# Patient Record
Sex: Male | Born: 1960 | Race: Black or African American | Hispanic: No | Marital: Married | Smoking: Current every day smoker
Health system: Southern US, Community
[De-identification: ages and names within clinical notes are randomized; demographics above are authoritative.]

## PROBLEM LIST (undated history)

## (undated) DIAGNOSIS — R519 Headache, unspecified: Secondary | ICD-10-CM

## (undated) DIAGNOSIS — M1990 Unspecified osteoarthritis, unspecified site: Secondary | ICD-10-CM

## (undated) DIAGNOSIS — R51 Headache: Secondary | ICD-10-CM

## (undated) DIAGNOSIS — R972 Elevated prostate specific antigen [PSA]: Secondary | ICD-10-CM

## (undated) DIAGNOSIS — E079 Disorder of thyroid, unspecified: Secondary | ICD-10-CM

## (undated) DIAGNOSIS — M545 Low back pain, unspecified: Secondary | ICD-10-CM

## (undated) DIAGNOSIS — G43909 Migraine, unspecified, not intractable, without status migrainosus: Secondary | ICD-10-CM

## (undated) HISTORY — DX: Low back pain, unspecified: M54.50

## (undated) HISTORY — DX: Headache, unspecified: R51.9

## (undated) HISTORY — DX: Low back pain: M54.5

## (undated) HISTORY — DX: Headache: R51

## (undated) HISTORY — PX: COLONOSCOPY: SHX174

---

## 2007-02-24 DIAGNOSIS — E039 Hypothyroidism, unspecified: Secondary | ICD-10-CM | POA: Insufficient documentation

## 2008-01-26 DIAGNOSIS — R454 Irritability and anger: Secondary | ICD-10-CM | POA: Insufficient documentation

## 2012-11-25 ENCOUNTER — Emergency Department: Payer: Self-pay | Admitting: Emergency Medicine

## 2013-04-07 ENCOUNTER — Emergency Department: Payer: Self-pay | Admitting: Emergency Medicine

## 2013-04-18 ENCOUNTER — Emergency Department: Payer: Self-pay | Admitting: Emergency Medicine

## 2013-04-29 ENCOUNTER — Emergency Department: Payer: Self-pay | Admitting: Emergency Medicine

## 2013-05-07 ENCOUNTER — Emergency Department: Payer: Self-pay | Admitting: Emergency Medicine

## 2013-10-20 ENCOUNTER — Ambulatory Visit: Payer: Self-pay | Admitting: Family Medicine

## 2013-10-20 ENCOUNTER — Emergency Department: Payer: Self-pay | Admitting: Emergency Medicine

## 2013-11-15 ENCOUNTER — Ambulatory Visit: Payer: Self-pay

## 2013-12-03 ENCOUNTER — Emergency Department: Payer: Self-pay | Admitting: Emergency Medicine

## 2014-06-15 DIAGNOSIS — F4310 Post-traumatic stress disorder, unspecified: Secondary | ICD-10-CM | POA: Insufficient documentation

## 2014-06-15 DIAGNOSIS — F331 Major depressive disorder, recurrent, moderate: Secondary | ICD-10-CM | POA: Insufficient documentation

## 2015-05-30 ENCOUNTER — Emergency Department
Admission: EM | Admit: 2015-05-30 | Discharge: 2015-05-30 | Disposition: A | Discharge status: Home / Self Care | Payer: Self-pay | Attending: Emergency Medicine | Admitting: Emergency Medicine

## 2015-05-30 ENCOUNTER — Encounter: Payer: Self-pay | Admitting: Emergency Medicine

## 2015-05-30 DIAGNOSIS — Z720 Tobacco use: Secondary | ICD-10-CM | POA: Insufficient documentation

## 2015-05-30 DIAGNOSIS — S80862A Insect bite (nonvenomous), left lower leg, initial encounter: Secondary | ICD-10-CM | POA: Insufficient documentation

## 2015-05-30 DIAGNOSIS — W57XXXA Bitten or stung by nonvenomous insect and other nonvenomous arthropods, initial encounter: Secondary | ICD-10-CM | POA: Insufficient documentation

## 2015-05-30 DIAGNOSIS — Y9389 Activity, other specified: Secondary | ICD-10-CM | POA: Insufficient documentation

## 2015-05-30 DIAGNOSIS — T63481A Toxic effect of venom of other arthropod, accidental (unintentional), initial encounter: Secondary | ICD-10-CM

## 2015-05-30 DIAGNOSIS — Y998 Other external cause status: Secondary | ICD-10-CM | POA: Insufficient documentation

## 2015-05-30 DIAGNOSIS — Y9289 Other specified places as the place of occurrence of the external cause: Secondary | ICD-10-CM | POA: Insufficient documentation

## 2015-05-30 DIAGNOSIS — Z79899 Other long term (current) drug therapy: Secondary | ICD-10-CM | POA: Insufficient documentation

## 2015-05-30 HISTORY — DX: Disorder of thyroid, unspecified: E07.9

## 2015-05-30 MED ORDER — TRIAMCINOLONE 0.1 % CREAM:EUCERIN CREAM 1:1: 1.0000 "application " | TOPICAL_CREAM | Freq: Three times a day (TID) | CUTANEOUS | Status: DC

## 2015-05-30 NOTE — ED Provider Notes (Signed)
[facility] Emergency Department Provider Note ____________________________________________  Time seen: Approximately 10:57 AM  I have reviewed the triage vital signs and the nursing notes.   HISTORY  Chief Complaint Insect Bite   HPI Brett Santos is a 54 y.o. male who presents to the emergency department for evaluation of an insect bite on the left leg that he received about 2 hours ago. He states that he felt something sting or bite him. He used some anti-itch cream just after it happened which provided relief for a few minutes. He states that the stinging and burning returned and was advised by his superior to come to the emergency department for evaluation.   Past Medical History  Diagnosis Date  . Thyroid disease     There are no active problems to display for this patient.   History reviewed. No pertinent past surgical history.  Current Outpatient Rx  Name  Route  Sig  Dispense  Refill  . levothyroxine (SYNTHROID, LEVOTHROID) 100 MCG tablet   Oral   Take 100 mcg by mouth daily before breakfast.         . Triamcinolone Acetonide (TRIAMCINOLONE 0.1 % CREAM : EUCERIN) CREA   Topical   Apply 1 application topically 3 (three) times daily.   1 each   0     Allergies Review of patient's allergies indicates no known allergies.  History reviewed. No pertinent family history.  Social History History  Substance Use Topics  . Smoking status: Current Every Day Smoker  . Smokeless tobacco: Not on file  . Alcohol Use: No    Review of Systems   Constitutional: No fever/chills Eyes: No visual changes. ENT: No congestion or rhinorrhea. Cardiovascular: Denies chest pain. Respiratory: Denies shortness of breath. Gastrointestinal: No abdominal pain.  No nausea, no vomiting.  No diarrhea.  No constipation. Genitourinary: Negative for dysuria. Musculoskeletal: Negative for back pain. Skin: Redness, burning, itching to left medial  knee/thigh Neurological: Negative for headaches, focal weakness or numbness.  10-point ROS otherwise negative.  ____________________________________________   PHYSICAL EXAM:  VITAL SIGNS: ED Triage Vitals  Enc Vitals Group     BP 05/30/15 0959 134/84 mmHg     Pulse Rate 05/30/15 0959 59     Resp 05/30/15 0959 18     Temp 05/30/15 0959 98.2 F (36.8 C)     Temp Source 05/30/15 0959 Oral     SpO2 05/30/15 0959 97 %     Weight 05/30/15 0959 180 lb (81.647 kg)     Height 05/30/15 0959 6' (1.829 m)     Head Cir --      Peak Flow --      Pain Score 05/30/15 1001 5     Pain Loc --      Pain Edu? --      Excl. in [place]? --     Constitutional: Alert and oriented. Well appearing and in no acute distress. Eyes: Conjunctivae are normal. PERRL. EOMI. Head: Atraumatic. Nose: No congestion/rhinnorhea. Mouth/Throat: Mucous membranes are moist.   Neck: No stridor. Cardiovascular: Normal rate, regular rhythm.  Good peripheral circulation. Respiratory: Normal respiratory effort.  No retractions. Lungs CTAB. Gastrointestinal: Soft and nontender. No distention. No abdominal bruits.  Musculoskeletal: No lower extremity tenderness nor edema.  No joint effusions. Neurologic:  Normal speech and language. No gross focal neurologic deficits are appreciated. Speech is normal. No gait instability. Skin: Macular,  approximately  3 cm area of very mild erythema present to the left medial knee/thigh.  No rash or vesicles noted. Psychiatric: Mood and affect are normal. Speech and behavior are normal.  ____________________________________________   LABS (all labs ordered are listed, but only abnormal results are displayed)  Labs Reviewed - No data to display ____________________________________________  EKG   ____________________________________________  RADIOLOGY   ____________________________________________   PROCEDURES  Procedure(s) performed:  None ____________________________________________   INITIAL IMPRESSION / ASSESSMENT AND PLAN / ED COURSE  Pertinent labs & imaging results that were available during my care of the patient were reviewed by me and considered in my medical decision making (see chart for details).  Patient was advised to follow-up with the primary care provider of his choice for any symptoms that are improving over the next few days. He was advised to return to the emergency department for symptoms that change or worsen if he is unable schedule an appointment. ____________________________________________   FINAL CLINICAL IMPRESSION(S) / ED DIAGNOSES  Final diagnoses:  Insect bites and stings, accidental or unintentional, initial encounter       Victorino Dike, FNP 05/30/15 [place], MD 05/30/15 1239

## 2015-05-30 NOTE — ED Notes (Signed)
Patient to ED with c/o insect bite to left inner leg about 1.5 hour ago. Reports area is now painful.

## 2015-06-19 ENCOUNTER — Encounter: Payer: Self-pay | Admitting: *Deleted

## 2015-06-19 ENCOUNTER — Emergency Department
Admission: EM | Admit: 2015-06-19 | Discharge: 2015-06-19 | Disposition: A | Discharge status: Home / Self Care | Payer: Self-pay | Attending: Emergency Medicine | Admitting: Emergency Medicine

## 2015-06-19 DIAGNOSIS — M545 Low back pain, unspecified: Secondary | ICD-10-CM

## 2015-06-19 DIAGNOSIS — Z720 Tobacco use: Secondary | ICD-10-CM | POA: Insufficient documentation

## 2015-06-19 DIAGNOSIS — Z79899 Other long term (current) drug therapy: Secondary | ICD-10-CM | POA: Insufficient documentation

## 2015-06-19 MED ORDER — CYCLOBENZAPRINE HCL 10 MG PO TABS: 10.0000 mg | ORAL_TABLET | Freq: Three times a day (TID) | ORAL | Status: DC | PRN

## 2015-06-19 MED ORDER — CYCLOBENZAPRINE HCL 10 MG PO TABS
10.0000 mg | ORAL_TABLET | Freq: Once | ORAL | Status: AC
  Administered 2015-06-19: 10 mg via ORAL
  Filled 2015-06-19: qty 1

## 2015-06-19 MED ORDER — MELOXICAM 15 MG PO TABS: 15.0000 mg | ORAL_TABLET | Freq: Every day | ORAL | Status: DC

## 2015-06-19 MED ORDER — MELOXICAM 7.5 MG PO TABS
15.0000 mg | ORAL_TABLET | Freq: Once | ORAL | Status: AC
  Administered 2015-06-19: 15 mg via ORAL
  Filled 2015-06-19: qty 2

## 2015-06-19 NOTE — ED Notes (Signed)
Pt has lower back pain.  Pt states he woke up with pain this morning.  No known injury to back

## 2015-06-19 NOTE — ED Notes (Signed)
Patient states he woke up this AM with severe back pain. He stays at [facility]. States he went to work [employer]) and went back to [employer]. Pain continued and now he is here. Pain is mid to right and radiates up back.

## 2015-06-19 NOTE — ED Provider Notes (Signed)
[facility] Emergency Department Provider Note ____________________________________________  Time seen: Approximately 11:48 PM  I have reviewed the triage vital signs and the nursing notes.   HISTORY  Chief Complaint Back Pain   HPI Jorgen Schirmer is a 54 y.o. male who presents to the emergency department for evaluation of lower back pain. He states that he noticed the pain when he woke up this morning. He is thinking that it may be coming from a mattress that he sleeping on the moment. Was able to go to work today, but the pain continued and he decided to come here for evaluation. He states that he tried to take some Aleve that did not help.   Past Medical History  Diagnosis Date  . Thyroid disease     There are no active problems to display for this patient.   No past surgical history on file.  Current Outpatient Rx  Name  Route  Sig  Dispense  Refill  . cyclobenzaprine (FLEXERIL) 10 MG tablet   Oral   Take 1 tablet (10 mg total) by mouth 3 (three) times daily as needed for muscle spasms.   30 tablet   0   . levothyroxine (SYNTHROID, LEVOTHROID) 100 MCG tablet   Oral   Take 100 mcg by mouth daily before breakfast.         . meloxicam (MOBIC) 15 MG tablet   Oral   Take 1 tablet (15 mg total) by mouth daily.   30 tablet   2   . Triamcinolone Acetonide (TRIAMCINOLONE 0.1 % CREAM : EUCERIN) CREA   Topical   Apply 1 application topically 3 (three) times daily.   1 each   0     Allergies Review of patient's allergies indicates no known allergies.  No family history on file.  Social History History  Substance Use Topics  . Smoking status: Current Every Day Smoker  . Smokeless tobacco: Not on file  . Alcohol Use: No    Review of Systems Constitutional: No recent illness. Eyes: No visual changes. ENT: No sore throat. Cardiovascular: Denies chest pain or palpitations. Respiratory: Denies shortness of breath. Gastrointestinal:  No abdominal pain.  Genitourinary: Negative for dysuria. Musculoskeletal: Pain in lumbar area Skin: Negative for rash. Neurological: Negative for headaches, focal weakness or numbness. 10-point ROS otherwise negative.  ____________________________________________   PHYSICAL EXAM:  VITAL SIGNS: ED Triage Vitals  Enc Vitals Group     BP 06/19/15 2115 102/49 mmHg     Pulse Rate 06/19/15 2115 69     Resp 06/19/15 2115 20     Temp 06/19/15 2115 98.4 F (36.9 C)     Temp Source 06/19/15 2115 Oral     SpO2 06/19/15 2115 99 %     Weight 06/19/15 2115 175 lb (79.379 kg)     Height 06/19/15 2115 6' (1.829 m)     Head Cir --      Peak Flow --      Pain Score 06/19/15 2116 10     Pain Loc --      Pain Edu? --      Excl. in [place]? --     Constitutional: Alert and oriented. Well appearing and in no acute distress. Eyes: Conjunctivae are normal. EOMI. Head: Atraumatic. Nose: No congestion/rhinnorhea. Neck: No stridor.  Respiratory: Normal respiratory effort.   Musculoskeletal: No midline tenderness upon palpation. Paraspinal tenderness noted. Neurologic:  Normal speech and language. No gross focal neurologic deficits are appreciated. Speech is normal.  No gait instability. Skin:  Skin is warm, dry and intact. Atraumatic. Psychiatric: Mood and affect are normal. Speech and behavior are normal.  ____________________________________________   LABS (all labs ordered are listed, but only abnormal results are displayed)  Labs Reviewed - No data to display ____________________________________________  RADIOLOGY  Not indicated ____________________________________________   PROCEDURES  Procedure(s) performed: None   ____________________________________________   INITIAL IMPRESSION / ASSESSMENT AND PLAN / ED COURSE  Pertinent labs & imaging results that were available during my care of the patient were reviewed by me and considered in my medical decision making (see chart for  details).  Patient was advised to take a muscle relaxer and anti-inflammatory. He was advised to rest and use ice. He was advised to follow-up with his primary care provider for symptoms that are not improving over the next few days. He is advised to return to the emergency department for symptoms that change or worsen if he is unable schedule an appointment. ____________________________________________   FINAL CLINICAL IMPRESSION(S) / ED DIAGNOSES  Final diagnoses:  Bilateral low back pain without sciatica       Victorino Dike, FNP 06/19/15 2351  Lisa Roca, MD 06/22/15 1553

## 2015-06-20 ENCOUNTER — Ambulatory Visit: Payer: Self-pay

## 2015-06-21 ENCOUNTER — Other Ambulatory Visit: Payer: Self-pay | Admitting: Urology

## 2015-06-21 DIAGNOSIS — M545 Low back pain: Secondary | ICD-10-CM

## 2015-06-22 ENCOUNTER — Emergency Department: Payer: Self-pay

## 2015-06-22 ENCOUNTER — Encounter: Payer: Self-pay | Admitting: Emergency Medicine

## 2015-06-22 ENCOUNTER — Emergency Department
Admission: EM | Admit: 2015-06-22 | Discharge: 2015-06-22 | Disposition: A | Discharge status: Home / Self Care | Payer: Self-pay | Attending: Emergency Medicine | Admitting: Emergency Medicine

## 2015-06-22 DIAGNOSIS — Y998 Other external cause status: Secondary | ICD-10-CM | POA: Insufficient documentation

## 2015-06-22 DIAGNOSIS — Y93H2 Activity, gardening and landscaping: Secondary | ICD-10-CM | POA: Insufficient documentation

## 2015-06-22 DIAGNOSIS — Z23 Encounter for immunization: Secondary | ICD-10-CM | POA: Insufficient documentation

## 2015-06-22 DIAGNOSIS — Y9289 Other specified places as the place of occurrence of the external cause: Secondary | ICD-10-CM | POA: Insufficient documentation

## 2015-06-22 DIAGNOSIS — S81812A Laceration without foreign body, left lower leg, initial encounter: Secondary | ICD-10-CM | POA: Insufficient documentation

## 2015-06-22 DIAGNOSIS — W11XXXA Fall on and from ladder, initial encounter: Secondary | ICD-10-CM | POA: Insufficient documentation

## 2015-06-22 DIAGNOSIS — Z720 Tobacco use: Secondary | ICD-10-CM | POA: Insufficient documentation

## 2015-06-22 DIAGNOSIS — S8012XA Contusion of left lower leg, initial encounter: Secondary | ICD-10-CM | POA: Insufficient documentation

## 2015-06-22 MED ORDER — BACITRACIN 500 UNIT/GM EX OINT
1.0000 "application " | TOPICAL_OINTMENT | Freq: Two times a day (BID) | CUTANEOUS | Status: DC
  Administered 2015-06-22: 1 via TOPICAL
  Filled 2015-06-22 (×2): qty 0.9

## 2015-06-22 MED ORDER — BACITRACIN ZINC 500 UNIT/GM EX OINT
TOPICAL_OINTMENT | CUTANEOUS | Status: AC
  Administered 2015-06-22: 1 via TOPICAL
  Filled 2015-06-22: qty 0.9

## 2015-06-22 MED ORDER — LIDOCAINE-EPINEPHRINE-TETRACAINE (LET) SOLUTION
3.0000 mL | Freq: Once | NASAL | Status: AC
  Administered 2015-06-22: 3 mL via TOPICAL
  Filled 2015-06-22: qty 3

## 2015-06-22 MED ORDER — TETANUS-DIPHTH-ACELL PERTUSSIS 5-2.5-18.5 LF-MCG/0.5 IM SUSP
0.5000 mL | Freq: Once | INTRAMUSCULAR | Status: AC
  Administered 2015-06-22: 0.5 mL via INTRAMUSCULAR
  Filled 2015-06-22: qty 0.5

## 2015-06-22 MED ORDER — LIDOCAINE HCL (PF) 1 % IJ SOLN
5.0000 mL | Freq: Once | INTRAMUSCULAR | Status: AC
  Administered 2015-06-22: 5 mL
  Filled 2015-06-22: qty 5

## 2015-06-22 NOTE — ED Provider Notes (Signed)
[facility] Emergency Department Provider Note  ____________________________________________  Time seen: Approximately 6:50 PM  I have reviewed the triage vital signs and the nursing notes.   HISTORY  Chief Complaint Extremity Laceration   HPI Brett Santos is a 54 y.o. male presents to the ER for the complaints of left leg laceration. Patient reports that this afternoon at 1 PM he was working outside trimming tree limbs. Patient states that he was several steps up on the ladder (unsure how high) and states as he trimmed one limb he lost his balance as the remainder of the tree limb shifted/moved and he fell. Patient states that when he fell he hit his left shin on the ladder causing laceration. Patient states that he has an abrasion from other branches to his face. Patient denies hitting head or loss of consciousness. Patient states he caught self when he fell. Patient reports pain only to the left shin where the laceration is present. Patient reports that he continued to work after the injury. Reports continues to ambulate well. States pain is 2 out of 10. Denies pain radiation.  Denies headache, nausea, vomiting, dizziness, neck or back pain. Denies chest pain, weakness, abdominal pain. Reports has continued to eat and drink well since fall. States he fell only because ladder moved when limb moved causing him to lose his balance.Patient reports that he feels well. Patient states that he was initially not going to come into the ER but states that his friend said that he probably needed stitches. Denies other complaints.   Past Medical History  Diagnosis Date  . Thyroid disease     There are no active problems to display for this patient.   History reviewed. No pertinent past surgical history.  Current Outpatient Rx  Name  Route  Sig  Dispense  Refill  . synthroid          .           .           .             Allergies Review of patient's allergies  indicates no known allergies.  No family history on file.  Social History History  Substance Use Topics  . Smoking status: Current Every Day Smoker  . Smokeless tobacco: Not on file  . Alcohol Use: No    Review of Systems Constitutional: No fever/chills Eyes: No visual changes. ENT: No sore throat. Cardiovascular: Denies chest pain. Respiratory: Denies shortness of breath. Gastrointestinal: No abdominal pain.  No nausea, no vomiting.  No diarrhea.  No constipation. Genitourinary: Negative for dysuria. Musculoskeletal: Negative for back pain. Skin: Negative for rash. Left leg laceration Neurological: Negative for headaches, focal weakness or numbness.  10-point ROS otherwise negative.  ____________________________________________   PHYSICAL EXAM:  VITAL SIGNS: ED Triage Vitals  Enc Vitals Group     BP 06/22/15 1825 115/79 mmHg     Pulse Rate 06/22/15 1825 98     Resp 06/22/15 1825 18     Temp 06/22/15 1825 98.1 F (36.7 C)     Temp Source 06/22/15 1825 Oral     SpO2 06/22/15 1825 99 %     Weight 06/22/15 1825 175 lb (79.379 kg)     Height 06/22/15 1825 6' (1.829 m)     Head Cir --      Peak Flow --      Pain Score --      Pain Loc --  Pain Edu? --      Excl. in [place]? --     Constitutional: Alert and oriented. Well appearing and in no acute distress. Smiling and laughing in room. Eyes: Conjunctivae are normal. PERRL. EOMI. No pain with EOMs.  Head: Atraumatic.nontender.   Nose: No congestion/rhinnorhea.  Mouth/Throat: Mucous membranes are moist.  Oropharynx non-erythematous. Neck: No stridor.  No cervical spine tenderness to palpation. Hematological/Lymphatic/Immunilogical: No cervical lymphadenopathy. Cardiovascular: Normal rate, regular rhythm. Grossly normal heart sounds.  Good peripheral circulation. Chest and torso nontender to palpation. Respiratory: Normal respiratory effort.  No retractions. Lungs CTAB. Gastrointestinal: Soft and nontender. No  distention. Normal Bowel sounds. No CVA tenderness. Musculoskeletal: No lower or upper extremity tenderness nor edema.  No joint effusions. Bilateral pedal pulses equal and easily palpated. No cervical, thoracic or lumbar tenderness to palpation. Except: left anterior lower tibial area 3 cm superficial laceration, mod TTP directly at laceration, left leg other wise nontender. Full ROM. Bilateral pedal pulses equal. Steady gait.  Neurologic:  Normal speech and language. No gross focal neurologic deficits are appreciated. No gait instability. Cranial nerve II through XII grossly intact. GCS 15. Skin:  Skin is warm, dry and intact. No rash noted.except: see above musculoskeletal. Mid forehead superficial abrasion, nontender, no ecchymosis.  Psychiatric: Mood and affect are normal. Speech and behavior are normal.  ____________________________________________   LABS (all labs ordered are listed, but only abnormal results are displayed)  Labs Reviewed - No data to display _ RADIOLOGY EXAM: LEFT TIBIA AND FIBULA - 2 VIEW  COMPARISON: None.  FINDINGS: There is no evidence of fracture or other focal bone lesions. Soft tissues are unremarkable.  IMPRESSION: Negative.   Electronically Signed By: Conchita Paris M.D. On: 06/22/2015 19:13      I, Marylene Land, personally viewed and evaluated these images as part of my medical decision making.    ____________________________________________   PROCEDURES  Procedure(s) performed:  LACERATION REPAIR Performed by: Marylene Land Authorized by: Marylene Land Consent: Verbal consent obtained. Risks and benefits: risks, benefits and alternatives were discussed Consent given by: patient Patient identity confirmed: provided demographic data Prepped and Draped in normal sterile fashion Wound explored  Laceration Location: left anterior shin Laceration Length: 3cm  No Foreign Bodies seen or palpated  Anesthesia: local  infiltration  Local anesthetic: lidocaine 1%  Anesthetic total: 3 ml  Irrigation method: syringe Amount of cleaning: copious irrigation and cleaning with betadine  Skin closure: 4-0 nylon  Number of sutures:4  Technique: simple interrupted.   Patient tolerance: Patient tolerated the procedure well with no immediate complications.  ____________________________________________   INITIAL IMPRESSION / ASSESSMENT AND PLAN / ED COURSE  Pertinent labs & imaging results that were available during my care of the patient were reviewed by me and considered in my medical decision making (see chart for details).  Very well-appearing patient. No acute distress. Presents the ER ambulatory, 5-6 hours post injury. Patient fell off ladder while trimming tree limbs due to limb shifting and he lost his balance. Patient reports hitting left shin on a ladder causing laceration. Denies head injury or loss of consciousness. No focal neurological deficits.Denies other pain or injury.   Left tib-fib x-ray negative. No fracture is no foreign body. Left lower leg laceration repaired. Tetanus updated. Patient tolerated well. Discussed follow-up and return parameters. Patient return to ER in 7-10 days for suture removal. Discussed cleaning and wound monitoring. Patient agreed to plan.   ____________________________________________   FINAL CLINICAL IMPRESSION(S) / ED DIAGNOSES  Final  diagnoses:  Leg laceration, left, initial encounter  Contusion of left leg, initial encounter       Marylene Land, NP 06/22/15 1957  Lisa Roca, MD 06/23/15 2101

## 2015-06-22 NOTE — ED Notes (Signed)
Patient to ER for c/o laceration to left lower leg. Has laceration approx 2 inches long with bleeding controlled. Patient states he was trimming tree when ladder fell. Patient denies any other injuries.

## 2015-06-22 NOTE — Discharge Instructions (Signed)
Keep area clean and dry. Clean daily with soap and water, rinse, pat dry and apply a thin layer topical antibiotic ointment such as Neosporin. Rest leg and elevate. Take over-the-counter Tylenol or ibuprofen as needed for pain.  Follow-up with your primary care physician as needed. Return to the ER in 7-10 days for suture removal. Return to the ER sooner for increased pain, redness, swelling, drainage, new or worsening concerns.  Laceration Care, Adult A laceration is a cut or lesion that goes through all layers of the skin and into the tissue just beneath the skin. TREATMENT  Some lacerations may not require closure. Some lacerations may not be able to be closed due to an increased risk of infection. It is important to see your caregiver as soon as possible after an injury to minimize the risk of infection and maximize the opportunity for successful closure. If closure is appropriate, pain medicines may be given, if needed. The wound will be cleaned to help prevent infection. Your caregiver will use stitches (sutures), staples, wound glue (adhesive), or skin adhesive strips to repair the laceration. These tools bring the skin edges together to allow for faster healing and a better cosmetic outcome. However, all wounds will heal with a scar. Once the wound has healed, scarring can be minimized by covering the wound with sunscreen during the day for 1 full year. HOME CARE INSTRUCTIONS  For sutures or staples:  Keep the wound clean and dry.  If you were given a bandage (dressing), you should change it at least once a day. Also, change the dressing if it becomes wet or dirty, or as directed by your caregiver.  Wash the wound with soap and water 2 times a day. Rinse the wound off with water to remove all soap. Pat the wound dry with a clean towel.  After cleaning, apply a thin layer of the antibiotic ointment as recommended by your caregiver. This will help prevent infection and keep the dressing from  sticking.  You may shower as usual after the first 24 hours. Do not soak the wound in water until the sutures are removed.  Only take over-the-counter or prescription medicines for pain, discomfort, or fever as directed by your caregiver.  Get your sutures or staples removed as directed by your caregiver. For skin adhesive strips:  Keep the wound clean and dry.  Do not get the skin adhesive strips wet. You may bathe carefully, using caution to keep the wound dry.  If the wound gets wet, pat it dry with a clean towel.  Skin adhesive strips will fall off on their own. You may trim the strips as the wound heals. Do not remove skin adhesive strips that are still stuck to the wound. They will fall off in time. For wound adhesive:  You may briefly wet your wound in the shower or bath. Do not soak or scrub the wound. Do not swim. Avoid periods of heavy perspiration until the skin adhesive has fallen off on its own. After showering or bathing, gently pat the wound dry with a clean towel.  Do not apply liquid medicine, cream medicine, or ointment medicine to your wound while the skin adhesive is in place. This may loosen the film before your wound is healed.  If a dressing is placed over the wound, be careful not to apply tape directly over the skin adhesive. This may cause the adhesive to be pulled off before the wound is healed.  Avoid prolonged exposure to sunlight or  tanning lamps while the skin adhesive is in place. Exposure to ultraviolet light in the first year will darken the scar.  The skin adhesive will usually remain in place for 5 to 10 days, then naturally fall off the skin. Do not pick at the adhesive film. You may need a tetanus shot if:  You cannot remember when you had your last tetanus shot.  You have never had a tetanus shot. If you get a tetanus shot, your arm may swell, get red, and feel warm to the touch. This is common and not a problem. If you need a tetanus shot and  you choose not to have one, there is a rare chance of getting tetanus. Sickness from tetanus can be serious. SEEK MEDICAL CARE IF:   You have redness, swelling, or increasing pain in the wound.  You see a red line that goes away from the wound.  You have yellowish-white fluid (pus) coming from the wound.  You have a fever.  You notice a bad smell coming from the wound or dressing.  Your wound breaks open before or after sutures have been removed.  You notice something coming out of the wound such as wood or glass.  Your wound is on your hand or foot and you cannot move a finger or toe. SEEK IMMEDIATE MEDICAL CARE IF:   Your pain is not controlled with prescribed medicine.  You have severe swelling around the wound causing pain and numbness or a change in color in your arm, hand, leg, or foot.  Your wound splits open and starts bleeding.  You have worsening numbness, weakness, or loss of function of any joint around or beyond the wound.  You develop painful lumps near the wound or on the skin anywhere on your body. MAKE SURE YOU:   Understand these instructions.  Will watch your condition.  Will get help right away if you are not doing well or get worse. Document Released: 11/04/2005 Document Revised: 01/27/2012 Document Reviewed: 04/30/2011 [facility] Patient Information 2015 [place], [place]. This information is not intended to replace advice given to you by your health care provider. Make sure you discuss any questions you have with your health care provider.  Contusion A contusion is a deep bruise. Contusions are the result of an injury that caused bleeding under the skin. The contusion may turn blue, purple, or yellow. Minor injuries will give you a painless contusion, but more severe contusions may stay painful and swollen for a few weeks.  CAUSES  A contusion is usually caused by a blow, trauma, or direct force to an area of the body. SYMPTOMS   Swelling and redness of  the injured area.  Bruising of the injured area.  Tenderness and soreness of the injured area.  Pain. DIAGNOSIS  The diagnosis can be made by taking a history and physical exam. An X-ray, CT scan, or MRI may be needed to determine if there were any associated injuries, such as fractures. TREATMENT  Specific treatment will depend on what area of the body was injured. In general, the best treatment for a contusion is resting, icing, elevating, and applying cold compresses to the injured area. Over-the-counter medicines may also be recommended for pain control. Ask your caregiver what the best treatment is for your contusion. HOME CARE INSTRUCTIONS   Put ice on the injured area.  Put ice in a plastic bag.  Place a towel between your skin and the bag.  Leave the ice on for 15-20 minutes,  3-4 times a day, or as directed by your health care provider.  Only take over-the-counter or prescription medicines for pain, discomfort, or fever as directed by your caregiver. Your caregiver may recommend avoiding anti-inflammatory medicines (aspirin, ibuprofen, and naproxen) for 48 hours because these medicines may increase bruising.  Rest the injured area.  If possible, elevate the injured area to reduce swelling. SEEK IMMEDIATE MEDICAL CARE IF:   You have increased bruising or swelling.  You have pain that is getting worse.  Your swelling or pain is not relieved with medicines. MAKE SURE YOU:   Understand these instructions.  Will watch your condition.  Will get help right away if you are not doing well or get worse. Document Released: 08/14/2005 Document Revised: 11/09/2013 Document Reviewed: 09/09/2011 [facility] Patient Information 2015 [place], [place]. This information is not intended to replace advice given to you by your health care provider. Make sure you discuss any questions you have with your health care provider.

## 2015-06-27 ENCOUNTER — Other Ambulatory Visit: Payer: Self-pay

## 2015-06-27 LAB — CBC AND DIFFERENTIAL
HEMATOCRIT: 42 % (ref 41–53)
Hemoglobin: 13.8 g/dL (ref 13.5–17.5)
Neutrophils Absolute: 3 /uL
Platelets: 206 10*3/uL (ref 150–399)
WBC: 7.5 10*3/mL

## 2015-06-27 LAB — TSH: TSH: 9.61 u[IU]/mL — AB (ref 0.41–5.90)

## 2015-06-27 LAB — HEMOGLOBIN A1C: Hemoglobin A1C: 5.9

## 2015-06-27 LAB — HEPATIC FUNCTION PANEL
ALT: 37 U/L (ref 10–40)
AST: 41 U/L — AB (ref 14–40)
Alkaline Phosphatase: 81 U/L (ref 25–125)
Bilirubin, Total: 0.2 mg/dL

## 2015-06-27 LAB — BASIC METABOLIC PANEL
BUN: 17 mg/dL (ref 4–21)
Creatinine: 1.1 mg/dL (ref 0.6–1.3)
GLUCOSE: 102 mg/dL
POTASSIUM: 4.5 mmol/L (ref 3.4–5.3)
Sodium: 139 mmol/L (ref 137–147)

## 2015-07-06 ENCOUNTER — Ambulatory Visit: Payer: Self-pay | Admitting: Licensed Clinical Social Worker

## 2015-07-27 ENCOUNTER — Institutional Professional Consult (permissible substitution): Payer: Self-pay | Admitting: Licensed Clinical Social Worker

## 2015-08-10 ENCOUNTER — Other Ambulatory Visit: Payer: Self-pay

## 2015-08-24 ENCOUNTER — Ambulatory Visit: Payer: Self-pay | Admitting: Licensed Clinical Social Worker

## 2015-08-29 ENCOUNTER — Other Ambulatory Visit: Payer: Self-pay

## 2015-09-07 ENCOUNTER — Ambulatory Visit: Payer: Self-pay | Admitting: Licensed Clinical Social Worker

## 2015-09-12 ENCOUNTER — Ambulatory Visit: Payer: Self-pay

## 2015-09-14 ENCOUNTER — Ambulatory Visit: Payer: Self-pay | Admitting: Licensed Clinical Social Worker

## 2015-09-28 ENCOUNTER — Ambulatory Visit: Payer: Self-pay | Admitting: Licensed Clinical Social Worker

## 2015-10-19 ENCOUNTER — Institutional Professional Consult (permissible substitution): Payer: Self-pay | Admitting: Licensed Clinical Social Worker

## 2015-10-24 ENCOUNTER — Ambulatory Visit: Payer: Self-pay

## 2016-01-02 ENCOUNTER — Ambulatory Visit: Payer: Self-pay

## 2016-01-04 ENCOUNTER — Ambulatory Visit: Payer: Self-pay

## 2016-01-04 DIAGNOSIS — E039 Hypothyroidism, unspecified: Secondary | ICD-10-CM | POA: Insufficient documentation

## 2016-01-11 ENCOUNTER — Other Ambulatory Visit: Payer: Self-pay

## 2016-01-12 DIAGNOSIS — E039 Hypothyroidism, unspecified: Secondary | ICD-10-CM

## 2016-02-01 ENCOUNTER — Other Ambulatory Visit: Payer: Self-pay

## 2016-02-08 ENCOUNTER — Ambulatory Visit: Payer: Self-pay | Admitting: Family Medicine

## 2016-02-08 VITALS — BP 97/67 | HR 90 | Wt 188.0 lb

## 2016-02-08 DIAGNOSIS — R7303 Prediabetes: Secondary | ICD-10-CM

## 2016-02-08 DIAGNOSIS — Z8669 Personal history of other diseases of the nervous system and sense organs: Secondary | ICD-10-CM

## 2016-02-08 DIAGNOSIS — E079 Disorder of thyroid, unspecified: Secondary | ICD-10-CM

## 2016-02-08 DIAGNOSIS — E049 Nontoxic goiter, unspecified: Secondary | ICD-10-CM

## 2016-02-08 DIAGNOSIS — N400 Enlarged prostate without lower urinary tract symptoms: Secondary | ICD-10-CM

## 2016-02-08 NOTE — Progress Notes (Signed)
Subjective:     Patient ID: Brett Santos, male   DOB: [birth year], 55 y.o.   MRN: [id number]  HPI Patient here to follow up on thyroid and pre-diabetes.  Review of Systems  Constitutional: Negative for activity change, appetite change and fatigue.  Respiratory: Negative for cough, chest tightness, shortness of breath and wheezing.   Cardiovascular: Negative for chest pain, palpitations and leg swelling.  Endocrine: Negative for polydipsia.  Genitourinary: Negative for urgency.       Nocturia x 3  Musculoskeletal: Positive for neck stiffness.       Objective:   Physical Exam  Constitutional: He is oriented to person, place, and time. He appears well-developed and well-nourished.  HENT:  Head: Normocephalic and atraumatic.  Eyes: Pupils are equal, round, and reactive to light.  Neck: Normal range of motion.  Goiter present  Cardiovascular: Normal rate, regular rhythm and normal heart sounds.   Pulmonary/Chest: Effort normal and breath sounds normal.  Neurological: He is alert and oriented to person, place, and time.  Skin: Skin is warm and dry.  Psychiatric: He has a normal mood and affect. His behavior is normal. Judgment and thought content normal.  Right sided goiter     Assessment:    1. Pre-diabetes Obtain Hgb A1C today.  Discussed diet and exercise with patient.  2. Goiter Obtain TSH today 3.Nocturia/BPH Check PSA     Plan:     I have done the exam and reviewed the above chart and it is accurate to the best of my knowledge.

## 2016-02-09 LAB — PSA: PROSTATE SPECIFIC AG, SERUM: 2.7 ng/mL (ref 0.0–4.0)

## 2016-02-09 LAB — TSH: TSH: 12.19 u[IU]/mL — ABNORMAL HIGH (ref 0.450–4.500)

## 2016-02-09 LAB — HEMOGLOBIN A1C
ESTIMATED AVERAGE GLUCOSE: 126 mg/dL
HEMOGLOBIN A1C: 6 % — AB (ref 4.8–5.6)

## 2016-02-22 ENCOUNTER — Ambulatory Visit: Payer: Self-pay

## 2016-11-21 ENCOUNTER — Ambulatory Visit: Payer: Self-pay

## 2016-11-28 ENCOUNTER — Ambulatory Visit: Payer: Self-pay

## 2016-12-05 ENCOUNTER — Ambulatory Visit: Payer: Self-pay

## 2016-12-10 ENCOUNTER — Ambulatory Visit: Payer: Self-pay

## 2016-12-24 ENCOUNTER — Ambulatory Visit: Payer: Self-pay

## 2017-01-14 ENCOUNTER — Ambulatory Visit: Payer: Self-pay | Admitting: Family Medicine

## 2017-01-14 VITALS — BP 123/80 | HR 87 | Temp 98.2°F | Ht 72.0 in | Wt 174.2 lb

## 2017-01-14 DIAGNOSIS — R7303 Prediabetes: Secondary | ICD-10-CM

## 2017-01-14 DIAGNOSIS — G8929 Other chronic pain: Secondary | ICD-10-CM

## 2017-01-14 DIAGNOSIS — M545 Low back pain: Secondary | ICD-10-CM

## 2017-01-14 DIAGNOSIS — R3911 Hesitancy of micturition: Secondary | ICD-10-CM

## 2017-01-14 MED ORDER — TERAZOSIN HCL 5 MG PO CAPS: 5.0000 mg | ORAL_CAPSULE | Freq: Every day | ORAL | 1 refills | Status: DC

## 2017-01-14 MED ORDER — TERAZOSIN HCL 1 MG PO CAPS: 1.0000 mg | ORAL_CAPSULE | Freq: Every day | ORAL | 3 refills | Status: DC

## 2017-01-14 MED ORDER — CYCLOBENZAPRINE HCL 5 MG PO TABS: 5.0000 mg | ORAL_TABLET | Freq: Three times a day (TID) | ORAL | 5 refills | Status: DC | PRN

## 2017-01-14 NOTE — Progress Notes (Signed)
     Primary Care Progress Note  Patient: Brett Santos Male    DOB: 1961/08/10   56 y.o.   MRN: [id number] Visit Date: 01/14/2017  Today's Provider: Lelon Huh, MD  Chief Complaint  Patient presents with  . Back Pain  . Urinary Retention    pressure/pain   Subjective:    HPI Patient states he has had trouble with low back pain and taken fexeril in the past which worked well.    Also having intermittent episodes of urinary hesitancy and urgency in the last several months.   Also due for follow up hypothyroid. Feels like energy level is good. Tolerating current dose levothyroxine  Lab Results  Component Value Date   TSH 12.190 (H) 02/08/2016   Also due for follow up diabetes with strong family history of diabetes.     No Known Allergies Previous Medications   CYCLOBENZAPRINE (FLEXERIL) 10 MG TABLET    Take 1 tablet (10 mg total) by mouth 3 (three) times daily as needed for muscle spasms.   LEVOTHYROXINE (SYNTHROID, LEVOTHROID) 100 MCG TABLET    Take 100 mcg by mouth daily before breakfast.   MELOXICAM (MOBIC) 15 MG TABLET    Take 1 tablet (15 mg total) by mouth daily.   TRIAMCINOLONE ACETONIDE (TRIAMCINOLONE 0.1 % CREAM : EUCERIN) CREA    Apply 1 application topically 3 (three) times daily.    Review of Systems  Social History  Substance Use Topics  . Smoking status: Current Every Day Smoker    Packs/day: 0.50    Years: 35.00  . Smokeless tobacco: Never Used  . Alcohol use No   Objective:   BP 123/80 (BP Location: Left Arm, Patient Position: Sitting)   Pulse 87   Temp 98.2 F (36.8 C)   Ht 6' (1.829 m)   Wt 174 lb 3.2 oz (79 kg)   BMI 23.63 kg/m   Physical Exam  General appearance: alert, well developed, well nourished, cooperative and in no distress Head: Normocephalic, without obvious abnormality, atraumatic Respiratory: Respirations even and unlabored, normal respiratory rate Extremities: No gross deformities Skin: Skin color, texture, turgor  normal. No rashes seen  Psych: Appropriate mood and affect. Neurologic: Mental status: Alert, oriented to person, place, and time, thought content appropriate.     Assessment & Plan:     1. Pre-diabetes  - Renal function panel - Hemoglobin A1c  2. Urinary hesitancy Likely BPH. He prefers to use [facility]. Start low dose terazosin. If too expensive or not tolerated we can send rx tamsulosin to [facility]. Check PSA.  - PSA - terazosin (HYTRIN) 1 MG capsule; Take 1 capsule (1 mg total) by mouth at bedtime.  Dispense: 30 capsule; Refill: 3  3. Chronic bilateral low back pain, with sciatica presence unspecified Did well on cyclobenzaprine in the past and refilled today.  - cyclobenzaprine (FLEXERIL) 5 MG tablet; Take 1-2 tablets (5-10 mg total) by mouth 3 (three) times daily as needed for muscle spasms.  Dispense: 30 tablet; Refill: 5       Lelon Huh, MD

## 2017-01-15 LAB — HEMOGLOBIN A1C
Est. average glucose Bld gHb Est-mCnc: 114 mg/dL
HEMOGLOBIN A1C: 5.6 % (ref 4.8–5.6)

## 2017-01-15 LAB — RENAL FUNCTION PANEL
ALBUMIN: 2.7 g/dL — AB (ref 3.5–5.5)
BUN/Creatinine Ratio: 10 (ref 9–20)
BUN: 12 mg/dL (ref 6–24)
CO2: 24 mmol/L (ref 18–29)
Calcium: 8.3 mg/dL — ABNORMAL LOW (ref 8.7–10.2)
Chloride: 104 mmol/L (ref 96–106)
Creatinine, Ser: 1.24 mg/dL (ref 0.76–1.27)
GFR calc Af Amer: 76 mL/min/{1.73_m2} (ref >59)
GFR calc non Af Amer: 65 mL/min/{1.73_m2} (ref >59)
Glucose: 109 mg/dL — ABNORMAL HIGH (ref 65–99)
Phosphorus: 2.8 mg/dL (ref 2.5–4.5)
Potassium: 4.1 mmol/L (ref 3.5–5.2)
Sodium: 144 mmol/L (ref 134–144)

## 2017-01-15 LAB — PSA, TOTAL AND FREE
PSA FREE: 0.55 ng/mL
PSA, Free Pct: 19 %
Prostate Specific Ag, Serum: 2.9 ng/mL (ref 0.0–4.0)

## 2017-01-28 ENCOUNTER — Ambulatory Visit: Payer: Self-pay

## 2017-02-11 ENCOUNTER — Ambulatory Visit: Payer: Self-pay

## 2017-02-18 ENCOUNTER — Ambulatory Visit: Payer: Self-pay

## 2017-03-13 ENCOUNTER — Other Ambulatory Visit: Payer: Self-pay

## 2017-03-13 DIAGNOSIS — E039 Hypothyroidism, unspecified: Secondary | ICD-10-CM

## 2017-03-13 MED ORDER — LEVOTHYROXINE SODIUM 100 MCG PO TABS: 100.0000 ug | ORAL_TABLET | Freq: Every day | ORAL | 3 refills | Status: DC

## 2017-04-08 ENCOUNTER — Ambulatory Visit: Payer: Self-pay

## 2017-04-10 ENCOUNTER — Ambulatory Visit: Payer: Self-pay

## 2017-05-18 ENCOUNTER — Emergency Department: Payer: Self-pay

## 2017-05-18 ENCOUNTER — Emergency Department
Admission: EM | Admit: 2017-05-18 | Discharge: 2017-05-18 | Disposition: A | Discharge status: Home / Self Care | Payer: Self-pay | Attending: Emergency Medicine | Admitting: Emergency Medicine

## 2017-05-18 ENCOUNTER — Encounter: Payer: Self-pay | Admitting: Emergency Medicine

## 2017-05-18 DIAGNOSIS — Z79899 Other long term (current) drug therapy: Secondary | ICD-10-CM | POA: Insufficient documentation

## 2017-05-18 DIAGNOSIS — Y929 Unspecified place or not applicable: Secondary | ICD-10-CM | POA: Insufficient documentation

## 2017-05-18 DIAGNOSIS — Y9389 Activity, other specified: Secondary | ICD-10-CM | POA: Insufficient documentation

## 2017-05-18 DIAGNOSIS — S63259A Unspecified dislocation of unspecified finger, initial encounter: Secondary | ICD-10-CM

## 2017-05-18 DIAGNOSIS — F17210 Nicotine dependence, cigarettes, uncomplicated: Secondary | ICD-10-CM | POA: Insufficient documentation

## 2017-05-18 DIAGNOSIS — E039 Hypothyroidism, unspecified: Secondary | ICD-10-CM | POA: Insufficient documentation

## 2017-05-18 DIAGNOSIS — W500XXA Accidental hit or strike by another person, initial encounter: Secondary | ICD-10-CM | POA: Insufficient documentation

## 2017-05-18 DIAGNOSIS — S63254A Unspecified dislocation of right ring finger, initial encounter: Secondary | ICD-10-CM | POA: Insufficient documentation

## 2017-05-18 DIAGNOSIS — Q899 Congenital malformation, unspecified: Secondary | ICD-10-CM

## 2017-05-18 DIAGNOSIS — Y999 Unspecified external cause status: Secondary | ICD-10-CM | POA: Insufficient documentation

## 2017-05-18 MED ORDER — TRAMADOL HCL 50 MG PO TABS: 50.0000 mg | ORAL_TABLET | Freq: Four times a day (QID) | ORAL | 0 refills | Status: DC | PRN

## 2017-05-18 MED ORDER — LIDOCAINE HCL (PF) 1 % IJ SOLN
INTRAMUSCULAR | Status: AC
  Filled 2017-05-18: qty 5

## 2017-05-18 MED ORDER — NAPROXEN 500 MG PO TABS: 500.0000 mg | ORAL_TABLET | Freq: Two times a day (BID) | ORAL | 0 refills | Status: DC

## 2017-05-18 MED ORDER — LIDOCAINE HCL (PF) 1 % IJ SOLN
5.0000 mL | Freq: Once | INTRAMUSCULAR | Status: AC
  Administered 2017-05-18: 5 mL via INTRADERMAL
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
## 2017-05-18 NOTE — ED Notes (Signed)
Pt to nursing station stating that he did not like the way that this RN had spoken to pt.  Pt requesting that someone other than this RN interact w/ pt in future.
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
## 2017-05-18 NOTE — Discharge Instructions (Signed)
Please follow up with the primary care or orthopedist. Keep the finger splinted until you are cleared to remove it. Take the medications as prescribed as needed. Return to the emergency department for symptoms that change or worsen if you're unable schedule an appointment.

## 2017-09-15 ENCOUNTER — Other Ambulatory Visit: Payer: Self-pay | Admitting: Urology

## 2017-09-15 DIAGNOSIS — E039 Hypothyroidism, unspecified: Secondary | ICD-10-CM

## 2018-01-13 ENCOUNTER — Ambulatory Visit: Payer: Self-pay

## 2018-01-27 ENCOUNTER — Ambulatory Visit: Payer: Self-pay

## 2018-04-10 ENCOUNTER — Emergency Department: Payer: Medicaid - Out of State

## 2018-04-10 ENCOUNTER — Other Ambulatory Visit: Payer: Self-pay

## 2018-04-10 ENCOUNTER — Encounter: Payer: Self-pay | Admitting: Emergency Medicine

## 2018-04-10 ENCOUNTER — Emergency Department
Admission: EM | Admit: 2018-04-10 | Discharge: 2018-04-10 | Disposition: A | Discharge status: Home / Self Care | Payer: Medicaid - Out of State | Attending: Emergency Medicine | Admitting: Emergency Medicine

## 2018-04-10 DIAGNOSIS — F17200 Nicotine dependence, unspecified, uncomplicated: Secondary | ICD-10-CM | POA: Diagnosis not present

## 2018-04-10 DIAGNOSIS — Y929 Unspecified place or not applicable: Secondary | ICD-10-CM | POA: Diagnosis not present

## 2018-04-10 DIAGNOSIS — Y999 Unspecified external cause status: Secondary | ICD-10-CM | POA: Diagnosis not present

## 2018-04-10 DIAGNOSIS — Y33XXXA Other specified events, undetermined intent, initial encounter: Secondary | ICD-10-CM | POA: Insufficient documentation

## 2018-04-10 DIAGNOSIS — Y9389 Activity, other specified: Secondary | ICD-10-CM | POA: Diagnosis not present

## 2018-04-10 DIAGNOSIS — M25561 Pain in right knee: Secondary | ICD-10-CM | POA: Diagnosis not present

## 2018-04-10 DIAGNOSIS — E039 Hypothyroidism, unspecified: Secondary | ICD-10-CM | POA: Diagnosis not present

## 2018-04-10 DIAGNOSIS — S20212A Contusion of left front wall of thorax, initial encounter: Secondary | ICD-10-CM

## 2018-04-10 DIAGNOSIS — Z79899 Other long term (current) drug therapy: Secondary | ICD-10-CM | POA: Insufficient documentation

## 2018-04-10 DIAGNOSIS — S299XXA Unspecified injury of thorax, initial encounter: Secondary | ICD-10-CM | POA: Diagnosis present

## 2018-04-10 DIAGNOSIS — G8929 Other chronic pain: Secondary | ICD-10-CM | POA: Diagnosis not present

## 2018-04-10 LAB — CBC WITH DIFFERENTIAL/PLATELET
BASOS ABS: 0 10*3/uL (ref 0–0.1)
Basophils Relative: 1 %
EOS PCT: 2 %
Eosinophils Absolute: 0.1 10*3/uL (ref 0–0.7)
HCT: 39.9 % — ABNORMAL LOW (ref 40.0–52.0)
Hemoglobin: 13.9 g/dL (ref 13.0–18.0)
Lymphocytes Relative: 26 %
Lymphs Abs: 2 10*3/uL (ref 1.0–3.6)
MCH: 32.9 pg (ref 26.0–34.0)
MCHC: 34.9 g/dL (ref 32.0–36.0)
MCV: 94.3 fL (ref 80.0–100.0)
MONO ABS: 0.7 10*3/uL (ref 0.2–1.0)
MONOS PCT: 9 %
Neutro Abs: 4.8 10*3/uL (ref 1.4–6.5)
Neutrophils Relative %: 62 %
PLATELETS: 192 10*3/uL (ref 150–440)
RBC: 4.23 MIL/uL — ABNORMAL LOW (ref 4.40–5.90)
RDW: 13.8 % (ref 11.5–14.5)
WBC: 7.7 10*3/uL (ref 3.8–10.6)

## 2018-04-10 LAB — COMPREHENSIVE METABOLIC PANEL
ALBUMIN: 2.1 g/dL — AB (ref 3.5–5.0)
ALK PHOS: 60 U/L (ref 38–126)
ALT: 26 U/L (ref 17–63)
AST: 41 U/L (ref 15–41)
Anion gap: 3 — ABNORMAL LOW (ref 5–15)
BILIRUBIN TOTAL: 0.3 mg/dL (ref 0.3–1.2)
BUN: 18 mg/dL (ref 6–20)
CALCIUM: 8 mg/dL — AB (ref 8.9–10.3)
CO2: 25 mmol/L (ref 22–32)
Chloride: 110 mmol/L (ref 101–111)
Creatinine, Ser: 1.39 mg/dL — ABNORMAL HIGH (ref 0.61–1.24)
GFR calc Af Amer: >60 mL/min (ref >60)
GFR calc non Af Amer: 55 mL/min — ABNORMAL LOW (ref >60)
GLUCOSE: 117 mg/dL — AB (ref 65–99)
POTASSIUM: 4.2 mmol/L (ref 3.5–5.1)
SODIUM: 138 mmol/L (ref 135–145)
TOTAL PROTEIN: 5.1 g/dL — AB (ref 6.5–8.1)

## 2018-04-10 LAB — TROPONIN I: Troponin I: <0.03 ng/mL (ref <0.03)

## 2018-04-10 NOTE — ED Notes (Signed)
First Nurse Note:  Patient states "I'm having tremendous chest pain", also complaining of right knee pain.  Alert and oriented.  Placed in [place].
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
## 2018-04-10 NOTE — ED Notes (Signed)
Patient transported to X-ray 

## 2018-04-10 NOTE — Discharge Instructions (Signed)
Use Tylenol or Motrin as needed for the pain. Please return for worse pain fever shortness of breath or any other problems.

## 2018-05-03 ENCOUNTER — Other Ambulatory Visit: Payer: Self-pay | Admitting: Urology

## 2018-05-03 DIAGNOSIS — E039 Hypothyroidism, unspecified: Secondary | ICD-10-CM

## 2018-05-19 ENCOUNTER — Ambulatory Visit: Payer: Self-pay

## 2018-06-05 ENCOUNTER — Other Ambulatory Visit: Payer: Self-pay

## 2018-06-05 DIAGNOSIS — E039 Hypothyroidism, unspecified: Secondary | ICD-10-CM

## 2018-06-05 MED ORDER — LEVOTHYROXINE SODIUM 100 MCG PO TABS: ORAL_TABLET | ORAL | 1 refills | Status: DC

## 2018-07-15 ENCOUNTER — Emergency Department
Admission: EM | Admit: 2018-07-15 | Discharge: 2018-07-15 | Disposition: A | Discharge status: Home / Self Care | Payer: Medicaid Other | Attending: Emergency Medicine | Admitting: Emergency Medicine

## 2018-07-15 ENCOUNTER — Encounter: Payer: Self-pay | Admitting: Emergency Medicine

## 2018-07-15 DIAGNOSIS — Z79899 Other long term (current) drug therapy: Secondary | ICD-10-CM | POA: Insufficient documentation

## 2018-07-15 DIAGNOSIS — E039 Hypothyroidism, unspecified: Secondary | ICD-10-CM | POA: Insufficient documentation

## 2018-07-15 DIAGNOSIS — F17210 Nicotine dependence, cigarettes, uncomplicated: Secondary | ICD-10-CM | POA: Diagnosis not present

## 2018-07-15 DIAGNOSIS — G43909 Migraine, unspecified, not intractable, without status migrainosus: Secondary | ICD-10-CM | POA: Diagnosis present

## 2018-07-15 DIAGNOSIS — G43901 Migraine, unspecified, not intractable, with status migrainosus: Secondary | ICD-10-CM | POA: Diagnosis not present

## 2018-07-15 HISTORY — DX: Migraine, unspecified, not intractable, without status migrainosus: G43.909

## 2018-07-15 HISTORY — DX: Unspecified osteoarthritis, unspecified site: M19.90

## 2018-07-15 MED ORDER — NAPROXEN 500 MG PO TABS: 500.0000 mg | ORAL_TABLET | Freq: Two times a day (BID) | ORAL | 2 refills | Status: AC

## 2018-07-15 MED ORDER — BUTALBITAL-APAP-CAFFEINE 50-325-40 MG PO TABS
ORAL_TABLET | ORAL | Status: AC
  Filled 2018-07-15: qty 1

## 2018-07-15 MED ORDER — BUTALBITAL-APAP-CAFFEINE 50-325-40 MG PO TABS: 1.0000 | ORAL_TABLET | Freq: Four times a day (QID) | ORAL | 0 refills | Status: AC | PRN

## 2018-07-15 MED ORDER — DIPHENHYDRAMINE HCL 50 MG/ML IJ SOLN
12.5000 mg | INTRAMUSCULAR | Status: DC
  Filled 2018-07-15: qty 1

## 2018-07-15 MED ORDER — BUTALBITAL-APAP-CAFFEINE 50-325-40 MG PO TABS
2.0000 | ORAL_TABLET | ORAL | Status: AC
  Administered 2018-07-15: 2 via ORAL

## 2018-07-15 MED ORDER — KETOROLAC TROMETHAMINE 30 MG/ML IJ SOLN
15.0000 mg | Freq: Once | INTRAMUSCULAR | Status: DC
  Filled 2018-07-15: qty 1

## 2018-07-15 MED ORDER — METOCLOPRAMIDE HCL 5 MG/ML IJ SOLN
10.0000 mg | INTRAMUSCULAR | Status: DC
  Filled 2018-07-15: qty 2

## 2018-07-15 MED ORDER — DEXAMETHASONE SODIUM PHOSPHATE 10 MG/ML IJ SOLN
10.0000 mg | Freq: Once | INTRAMUSCULAR | Status: DC
  Filled 2018-07-15: qty 1

## 2018-07-15 MED ORDER — SODIUM CHLORIDE 0.9 % IV BOLUS: 1000.0000 mL | Freq: Once | INTRAVENOUS | Status: DC

## 2018-07-15 NOTE — ED Provider Notes (Signed)
[facility] Emergency Department Provider Note  ____________________________________________   First MD Initiated Contact with Patient 07/15/18 1608     (approximate)  I have reviewed the triage vital signs and the nursing notes.   HISTORY  Chief Complaint Migraine; Shoulder Pain; and Knee Pain    HPI Brett Santos is a 57 y.o. male with medical history as listed below who presents for evaluation primarily of his headache.  He states that he has a history of migraines and been suffering from migraines more frequently over the last 2 weeks.  He says he is going through a "difficult time" right now and is currently living in a homeless shelter and this may contribute.  He says that he woke up last night about 3:00 in the morning and his "typical migraine" was back and was very severe.  It was making it difficult for him to sleep.  He feels it in the left side of his head and radiates straight through.  Loud noises and light make the symptoms worse, nothing in particular makes it better.  It is accompanied with nausea but no vomiting.  He has no lightheadedness or weakness or dizziness.  He is able to ambulate without difficulty.  He denies fever/chills, neck pain, neck stiffness, chest pain, vomiting, and abdominal pain.  He said that he has gotten some Fioricet from a friend and that seems to help for the short-term but then the headaches tend to come back.  He also reports persistent pain in his shoulders and knees, particular his left shoulder.  He states that he knows he has arthritis but that nothing particular seems to help except for some naproxen.  He takes it irregularly but it does make the pain better.  He says that his shoulder "clicks and pops" when he ranges his left shoulder around in the socket.  Again, he has no weakness in his arm but he is frustrated that nothing seems to be able to make his joint pain feel better.  No swelling, no redness, no warmth in  the joints.  No recent injuries.  Past Medical History:  Diagnosis Date  . Degenerative arthritis   . Headache   . Lower back pain    per patient  . Migraine   . Thyroid disease     Patient Active Problem List   Diagnosis Date Noted  . Pre-diabetes 01/14/2017  . Urinary hesitancy 01/14/2017  . Hypothyroidism 01/04/2016    History reviewed. No pertinent surgical history.  Prior to Admission medications   Medication Sig Start Date End Date Taking? Authorizing Provider  butalbital-acetaminophen-caffeine (FIORICET, ESGIC) [phone] MG tablet Take 1-2 tablets by mouth every 6 (six) hours as needed for headache. 07/15/18 07/15/19  Hinda Kehr, MD  cyclobenzaprine (FLEXERIL) 5 MG tablet Take 1-2 tablets (5-10 mg total) by mouth 3 (three) times daily as needed for muscle spasms. 01/14/17   Birdie Sons, MD  levothyroxine (SYNTHROID, LEVOTHROID) 100 MCG tablet TAKE 1 TABLET BY MOUTH ONCE DAILY BEFORE BREAKFAST 06/05/18   McGowan, Larene Beach A, PA-C  naproxen (NAPROSYN) 500 MG tablet Take 1 tablet (500 mg total) by mouth 2 (two) times daily with a meal. 07/15/18 07/15/19  Hinda Kehr, MD  terazosin (HYTRIN) 1 MG capsule Take 1 capsule (1 mg total) by mouth at bedtime. 01/14/17   Birdie Sons, MD  traMADol (ULTRAM) 50 MG tablet Take 1 tablet (50 mg total) by mouth every 6 (six) hours as needed. 05/18/17   Triplett, Cari B,  FNP  Triamcinolone Acetonide (TRIAMCINOLONE 0.1 % CREAM : EUCERIN) CREA Apply 1 application topically 3 (three) times daily. Patient not taking: Reported on 02/08/2016 05/30/15   Victorino Dike, FNP    Allergies Patient has no known allergies.  Family History  Problem Relation Age of Onset  . Diabetes type I Mother   . Diabetes Father   . Leukemia Father     Social History Social History   Tobacco Use  . Smoking status: Current Every Day Smoker    Packs/day: 0.50    Years: 35.00    Pack years: 17.50  . Smokeless tobacco: Never Used  Substance Use Topics  .  Alcohol use: No  . Drug use: Not on file    Review of Systems Constitutional: No fever/chills Eyes: No visual changes.  Photophobia. ENT: No sore throat. Cardiovascular: Denies chest pain. Respiratory: Denies shortness of breath. Gastrointestinal: No abdominal pain.  Nausea, no vomiting.  No diarrhea.  No constipation. Genitourinary: Negative for dysuria. Musculoskeletal: Pain in knees and shoulders, particular the left shoulder, chronic.  Negative for neck pain.  Negative for back pain. Integumentary: Negative for rash. Neurological: Headache as described above with no focal weakness, lightheadedness, dizziness, nor numbness.   ____________________________________________   PHYSICAL EXAM:  VITAL SIGNS: ED Triage Vitals  Enc Vitals Group     BP 07/15/18 1411 106/63     Pulse Rate 07/15/18 1411 83     Resp 07/15/18 1411 16     Temp 07/15/18 1411 98.3 F (36.8 C)     Temp Source 07/15/18 1411 Oral     SpO2 07/15/18 1411 98 %     Weight 07/15/18 1406 77.1 kg (170 lb)     Height 07/15/18 1406 1.829 m (6')     Head Circumference --      Peak Flow --      Pain Score 07/15/18 1406 8     Pain Loc --      Pain Edu? --      Excl. in [place]? --     Constitutional: Alert and oriented. Well appearing and in no acute distress. Eyes: Conjunctivae are normal. PERRL. EOMI. some photophobia. Head: Atraumatic. Nose: No congestion/rhinnorhea. Mouth/Throat: Mucous membranes are moist. Neck: No stridor.  No meningeal signs.   Cardiovascular: Normal rate, regular rhythm. Good peripheral circulation. Grossly normal heart sounds. Respiratory: Normal respiratory effort.  No retractions. Lungs CTAB. Gastrointestinal: Soft and nontender. No distention.  Musculoskeletal: No lower extremity tenderness nor edema. No gross deformities of extremities.  Pain with range of motion of left shoulder but the range is not limited and there is no sign of gross deformity or acute abnormality such as fracture or  dislocation. Neurologic:  Normal speech and language. No gross focal neurologic deficits are appreciated.  Skin:  Skin is warm, dry and intact. No rash noted. Psychiatric: Mood and affect are normal. Speech and behavior are normal.  ____________________________________________   LABS (all labs ordered are listed, but only abnormal results are displayed)  Labs Reviewed - No data to display ____________________________________________  EKG  None - EKG not ordered by ED physician ____________________________________________  RADIOLOGY   ED MD interpretation: No indication for imaging  Official radiology report(s): No results found.  ____________________________________________   PROCEDURES  Critical Care performed: No   Procedure(s) performed:   Procedures   ____________________________________________   INITIAL IMPRESSION / ASSESSMENT AND PLAN / ED COURSE  As part of my medical decision making, I reviewed the following data within  the electronic MEDICAL RECORD NUMBER Nursing notes reviewed and incorporated, Old chart reviewed and Notes from prior ED visits    Differential diagnosis includes, but is not limited to, migraine headache, acute intracranial abnormality such as a neoplasm or intracranial bleeding, cluster headache, rebound headache, generalized nonspecific headache.  He says that he takes naproxen but only occasionally and I do not think he is having rebound headaches.  He describes his symptoms as "my typical migraine" which is actually reassuring because he is used to the symptoms and it makes it less likely, in my opinion, to be the result of a new issue such as a neoplasm or intracranial bleed.  He has been having symptoms for several weeks and his headaches, when they occur, start gradually and get worse and are very consistent with migraine pain.  Regarding his joint pain, he is no sign of any acute or emergent abnormality.  We talked a little bit about  using naproxen regularly for the arthritis and following up with an outpatient doctor and he understands and agrees that there is no indication for any additional imaging today.  I planned to treat him with my usual migraine cocktail of metoclopramide, Benadryl, Toradol, etc., but the patient realized that it was getting late in the afternoon and if he was going to get back to the homeless shelter he needed to go soon.  As result, he asked if he does have his paperwork.  I gave him to Fioricet as well as a prescription for Fioricet since that seems to help him.  I gave him my usual and customary return precautions and he understands and agrees with the plan.     ____________________________________________  FINAL CLINICAL IMPRESSION(S) / ED DIAGNOSES  Final diagnoses:  Migraine with status migrainosus, not intractable, unspecified migraine type     MEDICATIONS GIVEN DURING THIS VISIT:  Medications  butalbital-acetaminophen-caffeine (FIORICET, ESGIC) 50-325-40 MG per tablet (has no administration in time range)  butalbital-acetaminophen-caffeine (FIORICET, ESGIC) 50-325-40 MG per tablet (has no administration in time range)  butalbital-acetaminophen-caffeine (FIORICET, ESGIC) 50-325-40 MG per tablet 2 tablet (2 tablets Oral Given 07/15/18 1733)     ED Discharge Orders         Ordered    butalbital-acetaminophen-caffeine (FIORICET, ESGIC) 50-325-40 MG tablet  Every 6 hours PRN     07/15/18 1732    naproxen (NAPROSYN) 500 MG tablet  2 times daily with meals     07/15/18 1735           Note:  This document was prepared using Dragon voice recognition software and may include unintentional dictation errors.    Hinda Kehr, MD 07/15/18 [phone]

## 2018-07-15 NOTE — ED Triage Notes (Signed)
Pt reports that he degenerative arthritis in his shoulders and knees. Pt reports hard to sleep due to the pain. Pt reports was seen here for it but they only did an x-ray. Pt inquiring about getting a MRI scan. Pt also reports a migraine. Pt reports hx of migraines as well and states this is no different.

## 2018-07-15 NOTE — Discharge Instructions (Addendum)

## 2018-09-11 ENCOUNTER — Other Ambulatory Visit: Payer: Self-pay | Admitting: Obstetrics and Gynecology

## 2018-09-11 ENCOUNTER — Ambulatory Visit
Admission: RE | Admit: 2018-09-11 | Discharge: 2018-09-11 | Disposition: A | Discharge status: Home / Self Care | Payer: Disability Insurance | Source: Ambulatory Visit | Attending: Obstetrics and Gynecology | Admitting: Obstetrics and Gynecology

## 2018-09-11 DIAGNOSIS — M19011 Primary osteoarthritis, right shoulder: Secondary | ICD-10-CM | POA: Diagnosis present

## 2018-09-11 DIAGNOSIS — M1990 Unspecified osteoarthritis, unspecified site: Secondary | ICD-10-CM

## 2018-09-11 DIAGNOSIS — M19012 Primary osteoarthritis, left shoulder: Secondary | ICD-10-CM | POA: Insufficient documentation

## 2018-09-11 DIAGNOSIS — M170 Bilateral primary osteoarthritis of knee: Secondary | ICD-10-CM | POA: Diagnosis present

## 2019-02-04 ENCOUNTER — Other Ambulatory Visit: Payer: Self-pay | Admitting: Urology

## 2019-02-04 DIAGNOSIS — E039 Hypothyroidism, unspecified: Secondary | ICD-10-CM

## 2019-07-13 ENCOUNTER — Other Ambulatory Visit: Payer: Self-pay | Admitting: Adult Health Nurse Practitioner

## 2019-07-13 DIAGNOSIS — E039 Hypothyroidism, unspecified: Secondary | ICD-10-CM

## 2019-07-16 ENCOUNTER — Other Ambulatory Visit: Payer: Self-pay | Admitting: Adult Health Nurse Practitioner

## 2019-07-16 DIAGNOSIS — E039 Hypothyroidism, unspecified: Secondary | ICD-10-CM

## 2019-08-27 ENCOUNTER — Other Ambulatory Visit: Payer: Self-pay

## 2019-08-27 ENCOUNTER — Encounter: Payer: Self-pay | Admitting: Physician Assistant

## 2019-08-27 ENCOUNTER — Telehealth: Payer: Self-pay

## 2019-08-27 DIAGNOSIS — Z1211 Encounter for screening for malignant neoplasm of colon: Secondary | ICD-10-CM

## 2019-08-27 NOTE — Telephone Encounter (Signed)
Gastroenterology Pre-Procedure Review  Request Date: 09/06/19 [facility] Requesting Physician: Dr. Vicente Males  PATIENT REVIEW QUESTIONS: The patient responded to the following health history questions as indicated:    1. Are you having any GI issues? no 2. Do you have a personal history of Polyps? no 3. Do you have a family history of Colon Cancer or Polyps? yes (mother colon polyps) 4. Diabetes Mellitus? no 5. Joint replacements in the past 12 months?no 6. Major health problems in the past 3 months?no 7. Any artificial heart valves, MVP, or defibrillator?no    MEDICATIONS & ALLERGIES:    Patient reports the following regarding taking any anticoagulation/antiplatelet therapy:   Plavix, Coumadin, Eliquis, Xarelto, Lovenox, Pradaxa, Brilinta, or Effient? no Aspirin? no  Patient confirms/reports the following medications:  Current Outpatient Medications  Medication Sig Dispense Refill  . cyclobenzaprine (FLEXERIL) 5 MG tablet Take 1-2 tablets (5-10 mg total) by mouth 3 (three) times daily as needed for muscle spasms. 30 tablet 5  . levothyroxine (SYNTHROID, LEVOTHROID) 100 MCG tablet TAKE 1 TABLET BY MOUTH ONCE DAILY BEFORE BREAKFAST 60 tablet 1  . terazosin (HYTRIN) 1 MG capsule Take 1 capsule (1 mg total) by mouth at bedtime. 30 capsule 3  . traMADol (ULTRAM) 50 MG tablet Take 1 tablet (50 mg total) by mouth every 6 (six) hours as needed. 12 tablet 0  . Triamcinolone Acetonide (TRIAMCINOLONE 0.1 % CREAM : EUCERIN) CREA Apply 1 application topically 3 (three) times daily. (Patient not taking: Reported on 02/08/2016) 1 each 0   No current facility-administered medications for this visit.     Patient confirms/reports the following allergies:  No Known Allergies  No orders of the defined types were placed in this encounter.   AUTHORIZATION INFORMATION Primary Insurance: 1D#: Group #:  Secondary Insurance: 1D#: Group #:  SCHEDULE INFORMATION: Date: 09/06/19 Time: Location:

## 2019-08-30 ENCOUNTER — Telehealth: Payer: Self-pay

## 2019-08-30 ENCOUNTER — Encounter: Payer: Self-pay | Admitting: *Deleted

## 2019-08-30 NOTE — Telephone Encounter (Signed)
Unable to contact patient. Trying to contact him in regards to picking up his colonoscopy instructions because he will not receive them in the mail in time for his procedure.  Thanks [employer]

## 2019-08-31 ENCOUNTER — Telehealth: Payer: Self-pay

## 2019-08-31 NOTE — Telephone Encounter (Signed)
Patient returned call regarding his colonoscopy instructions.  He will stop by the office on 09/02/19 after his COVID test to pick up his instructions.  Thanks [employer]

## 2019-08-31 NOTE — Telephone Encounter (Signed)
LVM for pt to call office to let me know if he prefers to pick up his instructions or email.  Thanks [employer]

## 2019-09-02 ENCOUNTER — Other Ambulatory Visit
Admission: RE | Admit: 2019-09-02 | Discharge: 2019-09-02 | Disposition: A | Discharge status: Home / Self Care | Payer: Medicaid Other | Source: Ambulatory Visit | Attending: Gastroenterology | Admitting: Gastroenterology

## 2019-09-02 ENCOUNTER — Other Ambulatory Visit: Payer: Self-pay

## 2019-09-02 DIAGNOSIS — Z01812 Encounter for preprocedural laboratory examination: Secondary | ICD-10-CM | POA: Insufficient documentation

## 2019-09-02 DIAGNOSIS — Z20828 Contact with and (suspected) exposure to other viral communicable diseases: Secondary | ICD-10-CM | POA: Diagnosis not present

## 2019-09-02 LAB — SARS CORONAVIRUS 2 (TAT 6-24 HRS): SARS Coronavirus 2: NEGATIVE

## 2019-09-06 ENCOUNTER — Other Ambulatory Visit: Payer: Self-pay

## 2019-09-06 ENCOUNTER — Encounter
Admission: RE | Disposition: A | Discharge status: Home / Self Care | Payer: Self-pay | Source: Ambulatory Visit | Attending: Gastroenterology

## 2019-09-06 ENCOUNTER — Ambulatory Visit: Payer: Medicaid Other | Admitting: Certified Registered Nurse Anesthetist

## 2019-09-06 ENCOUNTER — Encounter: Payer: Self-pay | Admitting: *Deleted

## 2019-09-06 ENCOUNTER — Ambulatory Visit
Admission: RE | Admit: 2019-09-06 | Discharge: 2019-09-06 | Disposition: A | Discharge status: Home / Self Care | Payer: Medicaid Other | Source: Ambulatory Visit | Attending: Gastroenterology | Admitting: Gastroenterology

## 2019-09-06 DIAGNOSIS — E039 Hypothyroidism, unspecified: Secondary | ICD-10-CM | POA: Diagnosis not present

## 2019-09-06 DIAGNOSIS — D122 Benign neoplasm of ascending colon: Secondary | ICD-10-CM | POA: Diagnosis not present

## 2019-09-06 DIAGNOSIS — F17210 Nicotine dependence, cigarettes, uncomplicated: Secondary | ICD-10-CM | POA: Insufficient documentation

## 2019-09-06 DIAGNOSIS — Z79890 Hormone replacement therapy (postmenopausal): Secondary | ICD-10-CM | POA: Insufficient documentation

## 2019-09-06 DIAGNOSIS — K5730 Diverticulosis of large intestine without perforation or abscess without bleeding: Secondary | ICD-10-CM | POA: Diagnosis not present

## 2019-09-06 DIAGNOSIS — Z8371 Family history of colonic polyps: Secondary | ICD-10-CM | POA: Insufficient documentation

## 2019-09-06 DIAGNOSIS — K635 Polyp of colon: Secondary | ICD-10-CM | POA: Diagnosis not present

## 2019-09-06 DIAGNOSIS — Z1211 Encounter for screening for malignant neoplasm of colon: Secondary | ICD-10-CM | POA: Insufficient documentation

## 2019-09-06 DIAGNOSIS — Z79899 Other long term (current) drug therapy: Secondary | ICD-10-CM | POA: Diagnosis not present

## 2019-09-06 DIAGNOSIS — K640 First degree hemorrhoids: Secondary | ICD-10-CM | POA: Diagnosis not present

## 2019-09-06 HISTORY — PX: COLONOSCOPY WITH PROPOFOL: SHX5780

## 2019-09-06 HISTORY — DX: Elevated prostate specific antigen (PSA): R97.20

## 2019-09-06 SURGERY — COLONOSCOPY WITH PROPOFOL
Anesthesia: General

## 2019-09-06 MED ORDER — PROPOFOL 500 MG/50ML IV EMUL
INTRAVENOUS | Status: DC | PRN
  Administered 2019-09-06: 175 ug/kg/min via INTRAVENOUS

## 2019-09-06 MED ORDER — PROPOFOL 10 MG/ML IV BOLUS
INTRAVENOUS | Status: DC | PRN
  Administered 2019-09-06: 30 mg via INTRAVENOUS
  Administered 2019-09-06: 20 mg via INTRAVENOUS
  Administered 2019-09-06: 80 mg via INTRAVENOUS

## 2019-09-06 MED ORDER — SODIUM CHLORIDE 0.9 % IV SOLN
INTRAVENOUS | Status: DC
  Administered 2019-09-06: 11:00:00 via INTRAVENOUS

## 2019-09-06 MED ORDER — LIDOCAINE HCL (CARDIAC) PF 100 MG/5ML IV SOSY
PREFILLED_SYRINGE | INTRAVENOUS | Status: DC | PRN
  Administered 2019-09-06: 50 mg via INTRAVENOUS

## 2019-09-06 NOTE — Anesthesia Procedure Notes (Signed)
Date/Time: 09/06/2019 11:15 AM Performed by: Johnna Acosta, CRNA Pre-anesthesia Checklist: Patient identified, Emergency Drugs available, Suction available, Patient being monitored and Timeout performed Patient Re-evaluated:Patient Re-evaluated prior to induction Oxygen Delivery Method: Nasal cannula Preoxygenation: Pre-oxygenation with 100% oxygen Induction Type: IV induction

## 2019-09-06 NOTE — Anesthesia Post-op Follow-up Note (Signed)
Anesthesia QCDR form completed.        

## 2019-09-06 NOTE — Anesthesia Preprocedure Evaluation (Signed)
Anesthesia Evaluation  Patient identified by MRN, date of birth, ID band Patient awake    Reviewed: Allergy & Precautions, H&P , NPO status , Patient's Chart, lab work & pertinent test results, reviewed documented beta blocker date and time   Airway Mallampati: II   Neck ROM: full    Dental  (+) Poor Dentition   Pulmonary neg pulmonary ROS, Current Smoker,    Pulmonary exam normal        Cardiovascular Exercise Tolerance: Good negative cardio ROS Normal cardiovascular exam Rhythm:regular Rate:Normal     Neuro/Psych  Headaches, negative psych ROS   GI/Hepatic negative GI ROS, Neg liver ROS,   Endo/Other  Hypothyroidism   Renal/GU negative Renal ROS  negative genitourinary   Musculoskeletal   Abdominal   Peds  Hematology negative hematology ROS (+)   Anesthesia Other Findings Past Medical History: No date: Degenerative arthritis No date: Elevated PSA No date: Headache No date: Lower back pain     Comment:  per patient No date: Migraine No date: Thyroid disease Past Surgical History: No date: COLONOSCOPY BMI    Body Mass Index: 23.73 kg/m     Reproductive/Obstetrics negative OB ROS                             Anesthesia Physical Anesthesia Plan  ASA: II  Anesthesia Plan: General   Post-op Pain Management:    Induction:   PONV Risk Score and Plan:   Airway Management Planned:   Additional Equipment:   Intra-op Plan:   Post-operative Plan:   Informed Consent: I have reviewed the patients History and Physical, chart, labs and discussed the procedure including the risks, benefits and alternatives for the proposed anesthesia with the patient or authorized representative who has indicated his/her understanding and acceptance.     Dental Advisory Given  Plan Discussed with: CRNA  Anesthesia Plan Comments:         Anesthesia Quick Evaluation

## 2019-09-06 NOTE — Op Note (Signed)
[facility] Gastroenterology Patient Name: Brett Santos Procedure Date: 09/06/2019 11:15 AM MRN: [id number] Account #: [id number] Date of Birth: Apr 27, 1961 Admit Type: Outpatient Age: 58 Room: [facility] ENDO ROOM 4 Gender: Male Note Status: Finalized Procedure:            Colonoscopy Indications:          Colon cancer screening in patient at increased risk:                        Family history of 1st-degree relative with colon polyps Providers:            Jonathon Bellows MD, MD Referring MD:         No Local Md, MD (Referring MD) Medicines:            Monitored Anesthesia Care Complications:        No immediate complications. Procedure:            Pre-Anesthesia Assessment:                       - Prior to the procedure, a History and Physical was                        performed, and patient medications, allergies and                        sensitivities were reviewed. The patient's tolerance of                        previous anesthesia was reviewed.                       - The risks and benefits of the procedure and the                        sedation options and risks were discussed with the                        patient. All questions were answered and informed                        consent was obtained.                       - ASA Grade Assessment: II - A patient with mild                        systemic disease.                       After obtaining informed consent, the colonoscope was                        passed under direct vision. Throughout the procedure,                        the patient's blood pressure, pulse, and oxygen                        saturations were monitored continuously. The  Colonoscope was introduced through the anus and                        advanced to the the cecum, identified by the                        appendiceal orifice. The colonoscopy was performed with                        ease. The patient  tolerated the procedure well. The                        quality of the bowel preparation was good. Findings:      The perianal and digital rectal examinations were normal.      Multiple small-mouthed diverticula were found in the sigmoid colon.      Non-bleeding internal hemorrhoids were found during retroflexion. The       hemorrhoids were medium-sized and Grade I (internal hemorrhoids that do       not prolapse).      A 5 mm polyp was found in the distal ascending colon. The polyp was       sessile. The polyp was removed with a cold snare. Resection and       retrieval were complete.      The exam was otherwise without abnormality on direct and retroflexion       views. Impression:           - Diverticulosis in the sigmoid colon.                       - Non-bleeding internal hemorrhoids.                       - One 5 mm polyp in the distal ascending colon, removed                        with a cold snare. Resected and retrieved.                       - The examination was otherwise normal on direct and                        retroflexion views. Recommendation:       - Discharge patient to home.                       - Resume previous diet.                       - Continue present medications.                       - Await pathology results.                       - Repeat colonoscopy in 5 years for surveillance. Procedure Code(s):    --- Professional ---                       [phone], Colonoscopy, flexible; with removal of tumor(s),  polyp(s), or other lesion(s) by snare technique Diagnosis Code(s):    --- Professional ---                       Z83.71, Family history of colonic polyps                       K64.0, First degree hemorrhoids                       K63.5, Polyp of colon                       K57.30, Diverticulosis of large intestine without                        perforation or abscess without bleeding CPT copyright 2019 American Medical Association.  All rights reserved. The codes documented in this report are preliminary and upon coder review may  be revised to meet current compliance requirements. Jonathon Bellows, MD Jonathon Bellows MD, MD 09/06/2019 11:38:45 AM This report has been signed electronically. Number of Addenda: 0 Note Initiated On: 09/06/2019 11:15 AM Scope Withdrawal Time: 0 hours 9 minutes 29 seconds  Total Procedure Duration: 0 hours 15 minutes 28 seconds  Estimated Blood Loss: Estimated blood loss: none.      [facility]

## 2019-09-06 NOTE — Transfer of Care (Signed)
Immediate Anesthesia Transfer of Care Note  Patient: Brett Santos  Procedure(s) Performed: COLONOSCOPY WITH PROPOFOL (N/A )  Patient Location: PACU  Anesthesia Type:General  Level of Consciousness: sedated  Airway & Oxygen Therapy: Patient Spontanous Breathing and Patient connected to nasal cannula oxygen  Post-op Assessment: Report given to RN and Post -op Vital signs reviewed and stable  Post vital signs: Reviewed and stable  Last Vitals:  Vitals Value Taken Time  BP 93/64 09/06/19 1143  Temp    Pulse 65 09/06/19 1144  Resp 15 09/06/19 1144  SpO2 99 % 09/06/19 1144  Vitals shown include unvalidated device data.  Last Pain:  Vitals:   09/06/19 1143  TempSrc:   PainSc: 0-No pain         Complications: No apparent anesthesia complications

## 2019-09-06 NOTE — H&P (Signed)
Jonathon Bellows, MD [address], [place], [place], [place], [address] [address], [place], [place], [place], [address] Phone: [phone]  Fax: [phone]  Primary Care Physician:  Crissie Figures, PA-C   Pre-Procedure History & Physical: HPI:  Brett Santos is a 58 y.o. male is here for an colonoscopy.   Past Medical History:  Diagnosis Date  . Degenerative arthritis   . Elevated PSA   . Headache   . Lower back pain    per patient  . Migraine   . Thyroid disease     Past Surgical History:  Procedure Laterality Date  . COLONOSCOPY      Prior to Admission medications   Medication Sig Start Date End Date Taking? Authorizing Provider  levothyroxine (SYNTHROID, LEVOTHROID) 100 MCG tablet TAKE 1 TABLET BY MOUTH ONCE DAILY BEFORE BREAKFAST 06/05/18  Yes McGowan, Larene Beach A, PA-C  terazosin (HYTRIN) 1 MG capsule Take 1 capsule (1 mg total) by mouth at bedtime. 01/14/17  Yes Birdie Sons, MD  cyclobenzaprine (FLEXERIL) 5 MG tablet Take 1-2 tablets (5-10 mg total) by mouth 3 (three) times daily as needed for muscle spasms. Patient not taking: Reported on 09/06/2019 01/14/17   Birdie Sons, MD  traMADol (ULTRAM) 50 MG tablet Take 1 tablet (50 mg total) by mouth every 6 (six) hours as needed. Patient not taking: Reported on 09/06/2019 05/18/17   Sherrie George B, FNP  Triamcinolone Acetonide (TRIAMCINOLONE 0.1 % CREAM : EUCERIN) CREA Apply 1 application topically 3 (three) times daily. Patient not taking: Reported on 02/08/2016 05/30/15   Victorino Dike, FNP    Allergies as of 08/27/2019  . (No Known Allergies)    Family History  Problem Relation Age of Onset  . Diabetes type I Mother   . Diabetes Father   . Leukemia Father     Social History   Socioeconomic History  . Marital status: Married    Spouse name: Not on file  . Number of children: Not on file  . Years of education: Not on file  . Highest education level: Not on file  Occupational History  .  Not on file  Social Needs  . Financial resource strain: Not on file  . Food insecurity    Worry: Not on file    Inability: Not on file  . Transportation needs    Medical: Not on file    Non-medical: Not on file  Tobacco Use  . Smoking status: Current Every Day Smoker    Packs/day: 0.50    Years: 35.00    Pack years: 17.50  . Smokeless tobacco: Never Used  Substance and Sexual Activity  . Alcohol use: Yes    Comment: rarely  . Drug use: Not Currently  . Sexual activity: Yes    Partners: Female  Lifestyle  . Physical activity    Days per week: Not on file    Minutes per session: Not on file  . Stress: Not on file  Relationships  . Social Herbalist on phone: Not on file    Gets together: Not on file    Attends religious service: Not on file    Active member of club or organization: Not on file    Attends meetings of clubs or organizations: Not on file    Relationship status: Not on file  . Intimate partner violence    Fear of current or ex partner: Not on file    Emotionally abused: Not on  file    Physically abused: Not on file    Forced sexual activity: Not on file  Other Topics Concern  . Not on file  Social History Narrative  . Not on file    Review of Systems: See HPI, otherwise negative ROS  Physical Exam: BP 111/62   Pulse (!) 52   Temp (!) 96.6 F (35.9 C) (Tympanic)   Resp 18   Ht 6' (1.829 m)   Wt 79.4 kg   SpO2 100%   BMI 23.73 kg/m  General:   Alert,  pleasant and cooperative in NAD Head:  Normocephalic and atraumatic. Neck:  Supple; no masses or thyromegaly. Lungs:  Clear throughout to auscultation, normal respiratory effort.    Heart:  +S1, +S2, Regular rate and rhythm, No edema. Abdomen:  Soft, nontender and nondistended. Normal bowel sounds, without guarding, and without rebound.   Neurologic:  Alert and  oriented x4;  grossly normal neurologically.  Impression/Plan: Brett Santos is here for an colonoscopy to be performed  for Screening colonoscopy family history of colon polyps   Risks, benefits, limitations, and alternatives regarding  colonoscopy have been reviewed with the patient.  Questions have been answered.  All parties agreeable.   Jonathon Bellows, MD  09/06/2019, 11:09 AM

## 2019-09-07 ENCOUNTER — Encounter: Payer: Self-pay | Admitting: Gastroenterology

## 2019-09-07 LAB — SURGICAL PATHOLOGY

## 2019-09-14 NOTE — Anesthesia Postprocedure Evaluation (Signed)
Anesthesia Post Note  Patient: Brett Santos  Procedure(s) Performed: COLONOSCOPY WITH PROPOFOL (N/A )  Patient location during evaluation: PACU Anesthesia Type: General Level of consciousness: awake and alert Pain management: pain level controlled Vital Signs Assessment: post-procedure vital signs reviewed and stable Respiratory status: spontaneous breathing, nonlabored ventilation, respiratory function stable and patient connected to nasal cannula oxygen Cardiovascular status: blood pressure returned to baseline and stable Postop Assessment: no apparent nausea or vomiting Anesthetic complications: no     Last Vitals:  Vitals:   09/06/19 1143 09/06/19 1200  BP: 93/64 120/88  Pulse: 68 71  Resp: 16 17  Temp:    SpO2: 99% 100%    Last Pain:  Vitals:   09/06/19 1200  TempSrc:   PainSc: 0-No pain                 Molli Barrows

## 2019-12-16 ENCOUNTER — Ambulatory Visit: Payer: Self-pay | Admitting: Urology

## 2020-02-07 ENCOUNTER — Encounter: Payer: Self-pay | Admitting: Urology

## 2020-02-07 ENCOUNTER — Ambulatory Visit: Payer: Self-pay | Admitting: Urology

## 2020-03-09 ENCOUNTER — Emergency Department
Admission: EM | Admit: 2020-03-09 | Discharge: 2020-03-09 | Disposition: A | Discharge status: Home / Self Care | Payer: Medicaid Other | Attending: Student in an Organized Health Care Education/Training Program | Admitting: Student in an Organized Health Care Education/Training Program

## 2020-03-09 ENCOUNTER — Emergency Department: Payer: Medicaid Other

## 2020-03-09 ENCOUNTER — Encounter: Payer: Self-pay | Admitting: Emergency Medicine

## 2020-03-09 ENCOUNTER — Other Ambulatory Visit: Payer: Self-pay

## 2020-03-09 DIAGNOSIS — Y939 Activity, unspecified: Secondary | ICD-10-CM | POA: Diagnosis not present

## 2020-03-09 DIAGNOSIS — R519 Headache, unspecified: Secondary | ICD-10-CM | POA: Insufficient documentation

## 2020-03-09 DIAGNOSIS — S61411D Laceration without foreign body of right hand, subsequent encounter: Secondary | ICD-10-CM | POA: Insufficient documentation

## 2020-03-09 DIAGNOSIS — Y92000 Kitchen of unspecified non-institutional (private) residence as  the place of occurrence of the external cause: Secondary | ICD-10-CM | POA: Diagnosis not present

## 2020-03-09 DIAGNOSIS — W01198A Fall on same level from slipping, tripping and stumbling with subsequent striking against other object, initial encounter: Secondary | ICD-10-CM | POA: Diagnosis not present

## 2020-03-09 DIAGNOSIS — Y999 Unspecified external cause status: Secondary | ICD-10-CM | POA: Insufficient documentation

## 2020-03-09 DIAGNOSIS — E039 Hypothyroidism, unspecified: Secondary | ICD-10-CM | POA: Insufficient documentation

## 2020-03-09 DIAGNOSIS — F17200 Nicotine dependence, unspecified, uncomplicated: Secondary | ICD-10-CM | POA: Insufficient documentation

## 2020-03-09 DIAGNOSIS — Z79899 Other long term (current) drug therapy: Secondary | ICD-10-CM | POA: Insufficient documentation

## 2020-03-09 LAB — CBC WITH DIFFERENTIAL/PLATELET
Abs Immature Granulocytes: 0.04 10*3/uL (ref 0.00–0.07)
Basophils Absolute: 0 10*3/uL (ref 0.0–0.1)
Basophils Relative: 0 %
Eosinophils Absolute: 0 10*3/uL (ref 0.0–0.5)
Eosinophils Relative: 0 %
HCT: 40.3 % (ref 39.0–52.0)
Hemoglobin: 13.5 g/dL (ref 13.0–17.0)
Immature Granulocytes: 0 %
Lymphocytes Relative: 16 %
Lymphs Abs: 1.5 10*3/uL (ref 0.7–4.0)
MCH: 31.3 pg (ref 26.0–34.0)
MCHC: 33.5 g/dL (ref 30.0–36.0)
MCV: 93.3 fL (ref 80.0–100.0)
Monocytes Absolute: 0.6 10*3/uL (ref 0.1–1.0)
Monocytes Relative: 6 %
Neutro Abs: 7.3 10*3/uL (ref 1.7–7.7)
Neutrophils Relative %: 78 %
Platelets: 218 10*3/uL (ref 150–400)
RBC: 4.32 MIL/uL (ref 4.22–5.81)
RDW: 13.6 % (ref 11.5–15.5)
WBC: 9.6 10*3/uL (ref 4.0–10.5)
nRBC: 0 % (ref 0.0–0.2)

## 2020-03-09 LAB — COMPREHENSIVE METABOLIC PANEL
ALT: 19 U/L (ref 0–44)
AST: 30 U/L (ref 15–41)
Albumin: 2.2 g/dL — ABNORMAL LOW (ref 3.5–5.0)
Alkaline Phosphatase: 46 U/L (ref 38–126)
Anion gap: 7 (ref 5–15)
BUN: 18 mg/dL (ref 6–20)
CO2: 24 mmol/L (ref 22–32)
Calcium: 8 mg/dL — ABNORMAL LOW (ref 8.9–10.3)
Chloride: 110 mmol/L (ref 98–111)
Creatinine, Ser: 1.35 mg/dL — ABNORMAL HIGH (ref 0.61–1.24)
GFR calc Af Amer: >60 mL/min (ref >60)
GFR calc non Af Amer: 57 mL/min — ABNORMAL LOW (ref >60)
Glucose, Bld: 114 mg/dL — ABNORMAL HIGH (ref 70–99)
Potassium: 3.9 mmol/L (ref 3.5–5.1)
Sodium: 141 mmol/L (ref 135–145)
Total Bilirubin: 0.3 mg/dL (ref 0.3–1.2)
Total Protein: 5.6 g/dL — ABNORMAL LOW (ref 6.5–8.1)

## 2020-03-09 MED ORDER — METOCLOPRAMIDE HCL 5 MG/ML IJ SOLN
10.0000 mg | Freq: Once | INTRAMUSCULAR | Status: AC
  Administered 2020-03-09: 10 mg via INTRAVENOUS
  Filled 2020-03-09: qty 2

## 2020-03-09 MED ORDER — SODIUM CHLORIDE 0.9 % IV BOLUS
1000.0000 mL | Freq: Once | INTRAVENOUS | Status: AC
  Administered 2020-03-09: 16:00:00 1000 mL via INTRAVENOUS

## 2020-03-09 MED ORDER — KETOROLAC TROMETHAMINE 10 MG PO TABS: 10.0000 mg | ORAL_TABLET | Freq: Four times a day (QID) | ORAL | 0 refills | Status: AC | PRN

## 2020-03-09 MED ORDER — DIPHENHYDRAMINE HCL 50 MG/ML IJ SOLN
25.0000 mg | Freq: Once | INTRAMUSCULAR | Status: AC
  Administered 2020-03-09: 25 mg via INTRAVENOUS
  Filled 2020-03-09: qty 1

## 2020-03-09 MED ORDER — KETOROLAC TROMETHAMINE 30 MG/ML IJ SOLN
30.0000 mg | Freq: Once | INTRAMUSCULAR | Status: AC
  Administered 2020-03-09: 30 mg via INTRAMUSCULAR
  Filled 2020-03-09: qty 1

## 2020-03-09 NOTE — ED Notes (Signed)
This RN witnessed pt resting with eyes closed, rise and fall of chest noted. Call light in reach, bed locked and low.

## 2020-03-09 NOTE — ED Notes (Signed)
Pt woken from rest by this RN, pt states, "that sleep was close to paradise, I feel totally better."

## 2020-03-09 NOTE — ED Triage Notes (Signed)
Pt presents to ED via [facility] c/o 5/10 L-sided headache starting 0600 today after slipping in kitchen Monday night and hitting head on countertop. Denies LOC. Denies blood thinner use. Pt states he took x2 percocet at 0700 today, x2 BC powders at 1100, and another x2 percocet at 1110 with no relief. Pt A&Ox4, yelling at person on phone during triage.

## 2020-03-09 NOTE — ED Provider Notes (Signed)
Emergency Department Provider Note  ____________________________________________  Time seen: Approximately 3:47 PM  I have reviewed the triage vital signs and the nursing notes.   HISTORY  Chief Complaint Headache   Historian Patient     HPI Brett Santos is a 59 y.o. male presents to the emergency department with acute onset of a frontal headache that started this morning.  Patient states that he has a history of migraines and his current headache feels similar but is stronger than what it normally is.  Patient states that he did have a head trauma on Monday.  He fell and hit his head on the countertop.  He states that he did not lose consciousness and did not have a persistent headache after injury occurred.  He denies current neck pain.  No numbness or tingling in the upper and lower extremities.  He denies current photophobia or phonophobia.  He states that he has taken Goody powders and approximately 4 Percocet today prior to presenting to the emergency department.  He states that his headache does improve with Percocet.  No fever or chills at home.   Past Medical History:  Diagnosis Date  . Degenerative arthritis   . Elevated PSA   . Headache   . Lower back pain    per patient  . Migraine   . Thyroid disease      Immunizations up to date:  Yes.     Past Medical History:  Diagnosis Date  . Degenerative arthritis   . Elevated PSA   . Headache   . Lower back pain    per patient  . Migraine   . Thyroid disease     Patient Active Problem List   Diagnosis Date Noted  . Pre-diabetes 01/14/2017  . Urinary hesitancy 01/14/2017  . Hypothyroidism 01/04/2016    Past Surgical History:  Procedure Laterality Date  . COLONOSCOPY    . COLONOSCOPY WITH PROPOFOL N/A 09/06/2019   Procedure: COLONOSCOPY WITH PROPOFOL;  Surgeon: Jonathon Bellows, MD;  Location: [facility] ENDOSCOPY;  Service: Gastroenterology;  Laterality: N/A;    Prior to Admission medications   Medication  Sig Start Date End Date Taking? Authorizing Provider  cyclobenzaprine (FLEXERIL) 5 MG tablet Take 1-2 tablets (5-10 mg total) by mouth 3 (three) times daily as needed for muscle spasms. Patient not taking: Reported on 09/06/2019 01/14/17   Birdie Sons, MD  ketorolac (TORADOL) 10 MG tablet Take 1 tablet (10 mg total) by mouth every 6 (six) hours as needed for up to 5 days. 03/09/20 03/14/20  Lannie Fields, PA-C  levothyroxine (SYNTHROID, LEVOTHROID) 100 MCG tablet TAKE 1 TABLET BY MOUTH ONCE DAILY BEFORE BREAKFAST 06/05/18   McGowan, Larene Beach A, PA-C  terazosin (HYTRIN) 1 MG capsule Take 1 capsule (1 mg total) by mouth at bedtime. 01/14/17   Birdie Sons, MD  traMADol (ULTRAM) 50 MG tablet Take 1 tablet (50 mg total) by mouth every 6 (six) hours as needed. Patient not taking: Reported on 09/06/2019 05/18/17   Sherrie George B, FNP  Triamcinolone Acetonide (TRIAMCINOLONE 0.1 % CREAM : EUCERIN) CREA Apply 1 application topically 3 (three) times daily. Patient not taking: Reported on 02/08/2016 05/30/15   Victorino Dike, FNP    Allergies Patient has no known allergies.  Family History  Problem Relation Age of Onset  . Diabetes type I Mother   . Diabetes Father   . Leukemia Father     Social History Social History   Tobacco Use  . Smoking status: Current  Every Day Smoker    Packs/day: 0.50    Years: 35.00    Pack years: 17.50  . Smokeless tobacco: Never Used  Substance Use Topics  . Alcohol use: Yes    Comment: rarely  . Drug use: Not Currently     Review of Systems  Constitutional: No fever/chills Eyes:  No discharge ENT: No upper respiratory complaints. Respiratory: no cough. No SOB/ use of accessory muscles to breath Gastrointestinal:   No nausea, no vomiting.  No diarrhea.  No constipation. Musculoskeletal: Negative for musculoskeletal pain. Neuro: Patient has headache.  Skin: Negative for rash, abrasions, lacerations,  ecchymosis.    ____________________________________________   PHYSICAL EXAM:  VITAL SIGNS: ED Triage Vitals  Enc Vitals Group     BP 03/09/20 1347 101/60     Pulse Rate 03/09/20 1347 73     Resp 03/09/20 1347 15     Temp 03/09/20 1347 98.1 F (36.7 C)     Temp Source 03/09/20 1347 Oral     SpO2 03/09/20 1347 98 %     Weight 03/09/20 1345 175 lb (79.4 kg)     Height 03/09/20 1345 6' (1.829 m)     Head Circumference --      Peak Flow --      Pain Score 03/09/20 1348 5     Pain Loc --      Pain Edu? --      Excl. in [place]? --      Constitutional: Alert and oriented. Well appearing and in no acute distress. Eyes: Conjunctivae are normal. PERRL. EOMI. Head: Atraumatic. ENT:      Ears: TMs are pearly.      Nose: No congestion/rhinnorhea.      Mouth/Throat: Mucous membranes are moist.  Neck: No stridor.  Full range of motion.  No midline C-spine tenderness to palpation. Cardiovascular: Normal rate, regular rhythm. Normal S1 and S2.  Good peripheral circulation. Respiratory: Normal respiratory effort without tachypnea or retractions. Lungs CTAB. Good air entry to the bases with no decreased or absent breath sounds Gastrointestinal: Bowel sounds x 4 quadrants. Soft and nontender to palpation. No guarding or rigidity. No distention. Musculoskeletal: Grip strength is symmetric.  5 out of 5 strength in the upper and lower extremities bilaterally and symmetrically full range of motion to all extremities. No obvious deformities noted Neurologic:  Normal for age. No gross focal neurologic deficits are appreciated.  Patient can perform hand to nose.  No hypo or hyperreflexia.  Negative Romberg. Skin:  Skin is warm, dry and intact. No rash noted. Psychiatric: Mood and affect are normal for age. Speech and behavior are normal.   ____________________________________________   LABS (all labs ordered are listed, but only abnormal results are displayed)  Labs Reviewed  COMPREHENSIVE  METABOLIC PANEL - Abnormal; Notable for the following components:      Result Value   Glucose, Bld 114 (*)    Creatinine, Ser 1.35 (*)    Calcium 8.0 (*)    Total Protein 5.6 (*)    Albumin 2.2 (*)    GFR calc non Af Amer 57 (*)    All other components within normal limits  CBC WITH DIFFERENTIAL/PLATELET   ____________________________________________  EKG   ____________________________________________  RADIOLOGY Unk Pinto, personally viewed and evaluated these images (plain radiographs) as part of my medical decision making, as well as reviewing the written report by the radiologist.  CT Head Wo Contrast  Result Date: 03/09/2020 CLINICAL DATA:  L-sided headache starting  0600 today after slipping in kitchen Monday night and hitting head on countertop. Denies LOC. Denies blood thinner use EXAM: CT HEAD WITHOUT CONTRAST TECHNIQUE: Contiguous axial images were obtained from the base of the skull through the vertex without intravenous contrast. COMPARISON:  None. FINDINGS: Brain: No evidence of acute infarction, hemorrhage, hydrocephalus, extra-axial collection or mass lesion/mass effect. Mild areas of subcortical white matter hypoattenuation are noted consistent with chronic microvascular ischemic change. Vascular: No hyperdense vessel or unexpected calcification. Skull: Normal. Negative for fracture or focal lesion. Sinuses/Orbits: Visualized globes and orbits are unremarkable. The visualized sinuses are clear. Other: None. IMPRESSION: 1. No acute intracranial abnormalities. 2. Mild chronic microvascular ischemic change. Electronically Signed   By: Lajean Manes M.D.   On: 03/09/2020 14:42    ____________________________________________    PROCEDURES  Procedure(s) performed:     Procedures     Medications  sodium chloride 0.9 % bolus 1,000 mL (1,000 mLs Intravenous New Bag/Given 03/09/20 1627)  ketorolac (TORADOL) 30 MG/ML injection 30 mg (30 mg Intramuscular Given  03/09/20 1627)  metoCLOPramide (REGLAN) injection 10 mg (10 mg Intravenous Given 03/09/20 1628)  diphenhydrAMINE (BENADRYL) injection 25 mg (25 mg Intravenous Given 03/09/20 1627)     ____________________________________________   INITIAL IMPRESSION / ASSESSMENT AND PLAN / ED COURSE  Pertinent labs & imaging results that were available during my care of the patient were reviewed by me and considered in my medical decision making (see chart for details).      Assessment and Plan:  Headache:  59 year old male presents to the emergency department with a persistent headache for the past 4 to 5 days.  Vital signs are reassuring at triage.  Neuro exam was appropriate without acute deficits.  Differential diagnosis includes subarachnoid hemorrhage, subdural hematoma, skull fracture.  CT head revealed no evidence of intracranial bleed.  Patient was given Toradol, Benadryl, Reglan and supplemental fluids in the emergency department he reported that his headache completely resolved.  He states that he feels well enough to go home.  He was discharged with oral Toradol.  He was advised to follow-up with primary care if his migraines persist and he needs an abortive migraine medicine.  Return precautions were given to return with new or worsening symptoms.  All patient questions were answered.  ____________________________________________  FINAL CLINICAL IMPRESSION(S) / ED DIAGNOSES  Final diagnoses:  Acute nonintractable headache, unspecified headache type      NEW MEDICATIONS STARTED DURING THIS VISIT:  ED Discharge Orders         Ordered    ketorolac (TORADOL) 10 MG tablet  Every 6 hours PRN     03/09/20 1725              This chart was dictated using voice recognition software/Dragon. Despite best efforts to proofread, errors can occur which can change the meaning. Any change was purely unintentional.     Lannie Fields, PA-C 03/09/20 1732    Merlyn Lot,  MD 03/09/20 [phone]

## 2020-03-09 NOTE — ED Notes (Signed)
Pt requesting migraine medicine and splint and bandage and antibiotic ointment for right thumb/hand. Pt told requests would be relayed to EDP.

## 2020-03-09 NOTE — ED Notes (Signed)
Patient requests for laceration to be examined while in ER. Had suture repair done on Monday at urgent care, one suture came out prematurely.

## 2020-03-09 NOTE — Discharge Instructions (Signed)
Increase hydration at home. Take Toradol for headache.  Return to the ED with new or worsening symptoms.

## 2020-03-14 ENCOUNTER — Other Ambulatory Visit: Payer: Self-pay

## 2020-03-14 ENCOUNTER — Emergency Department
Admission: EM | Admit: 2020-03-14 | Discharge: 2020-03-14 | Discharge status: Against Medical Advice | Payer: Medicaid Other | Attending: Student | Admitting: Student

## 2020-03-14 ENCOUNTER — Encounter: Payer: Self-pay | Admitting: Emergency Medicine

## 2020-03-14 DIAGNOSIS — E039 Hypothyroidism, unspecified: Secondary | ICD-10-CM | POA: Diagnosis not present

## 2020-03-14 DIAGNOSIS — G43819 Other migraine, intractable, without status migrainosus: Secondary | ICD-10-CM | POA: Insufficient documentation

## 2020-03-14 DIAGNOSIS — R519 Headache, unspecified: Secondary | ICD-10-CM | POA: Diagnosis present

## 2020-03-14 DIAGNOSIS — F17210 Nicotine dependence, cigarettes, uncomplicated: Secondary | ICD-10-CM | POA: Insufficient documentation

## 2020-03-14 DIAGNOSIS — Z79899 Other long term (current) drug therapy: Secondary | ICD-10-CM | POA: Diagnosis not present

## 2020-03-14 NOTE — ED Notes (Signed)
See triage note- Pt reports migraine that started several hours ago w/ sensitivity to light. States left side of head hurts. Reports hx of chronic migraines.

## 2020-03-14 NOTE — ED Provider Notes (Signed)
[facility] Emergency Department Provider Note  ____________________________________________  Time seen: Approximately 2:17 PM  I have reviewed the triage vital signs and the nursing notes.   HISTORY  Chief Complaint Migraine    HPI Brett Santos is a 59 y.o. male that presents to the emergency department for evaluation of migraine.  Patient has a history of migraines and this feels the same.  He was here last week for a similar migraine after he hit his head. He was unable to pick up his prescription medication.   Past Medical History:  Diagnosis Date  . Degenerative arthritis   . Elevated PSA   . Headache   . Lower back pain    per patient  . Migraine   . Thyroid disease     Patient Active Problem List   Diagnosis Date Noted  . Pre-diabetes 01/14/2017  . Urinary hesitancy 01/14/2017  . Hypothyroidism 01/04/2016    Past Surgical History:  Procedure Laterality Date  . COLONOSCOPY    . COLONOSCOPY WITH PROPOFOL N/A 09/06/2019   Procedure: COLONOSCOPY WITH PROPOFOL;  Surgeon: Jonathon Bellows, MD;  Location: [facility] ENDOSCOPY;  Service: Gastroenterology;  Laterality: N/A;    Prior to Admission medications   Medication Sig Start Date End Date Taking? Authorizing Provider  ketorolac (TORADOL) 10 MG tablet Take 1 tablet (10 mg total) by mouth every 6 (six) hours as needed for up to 5 days. 03/09/20 03/14/20  Lannie Fields, PA-C  levothyroxine (SYNTHROID, LEVOTHROID) 100 MCG tablet TAKE 1 TABLET BY MOUTH ONCE DAILY BEFORE BREAKFAST 06/05/18   McGowan, Larene Beach A, PA-C  terazosin (HYTRIN) 1 MG capsule Take 1 capsule (1 mg total) by mouth at bedtime. 01/14/17   Birdie Sons, MD    Allergies Patient has no known allergies.  Family History  Problem Relation Age of Onset  . Diabetes type I Mother   . Diabetes Father   . Leukemia Father     Social History Social History   Tobacco Use  . Smoking status: Current Every Day Smoker    Packs/day: 0.50     Years: 35.00    Pack years: 17.50  . Smokeless tobacco: Never Used  Substance Use Topics  . Alcohol use: Yes    Comment: rarely  . Drug use: Not Currently     Review of Systems  Gastrointestinal: No nausea, no vomiting.  Musculoskeletal: Negative for musculoskeletal pain. Skin: Negative for rash, abrasions, lacerations, ecchymosis. Neurological: Positive for migraine   ____________________________________________   PHYSICAL EXAM:  VITAL SIGNS: ED Triage Vitals  Enc Vitals Group     BP 03/14/20 1336 110/82     Pulse Rate 03/14/20 1336 70     Resp 03/14/20 1336 16     Temp 03/14/20 1336 98 F (36.7 C)     Temp Source 03/14/20 1336 Oral     SpO2 03/14/20 1336 98 %     Weight 03/14/20 1337 174 lb 15.7 oz (79.4 kg)     Height 03/14/20 1337 6' (1.829 m)     Head Circumference --      Peak Flow --      Pain Score 03/14/20 1337 8     Pain Loc --      Pain Edu? --      Excl. in [place]? --      Constitutional: Alert and oriented.  Eyes: Conjunctivae are normal. PERRL. EOMI. Head: Atraumatic. ENT:      Ears:      Nose: No congestion/rhinnorhea.  Mouth/Throat: Mucous membranes are moist.  Neck: No stridor. Cardiovascular: Normal rate, regular rhythm.  Good peripheral circulation. Respiratory: Normal respiratory effort without tachypnea or retractions. Lungs CTAB. Good air entry to the bases with no decreased or absent breath sounds. Musculoskeletal: Full range of motion to all extremities. No gross deformities appreciated. Neurologic:  Normal speech and language. No gross focal neurologic deficits are appreciated.  Skin:  Skin is warm, dry and intact. No rash noted. Psychiatric: Mood and affect are normal. Speech and behavior are normal. Patient exhibits appropriate insight and judgement.   ____________________________________________   LABS (all labs ordered are listed, but only abnormal results are displayed)  Labs Reviewed - No data to  display ____________________________________________  EKG   ____________________________________________  RADIOLOGY  No results found.  ____________________________________________    PROCEDURES  Procedure(s) performed:    Procedures    Medications - No data to display   ____________________________________________   INITIAL IMPRESSION / ASSESSMENT AND PLAN / ED COURSE  Pertinent labs & imaging results that were available during my care of the patient were reviewed by me and considered in my medical decision making (see chart for details).  Review of the [place] CSRS was performed in accordance of the [place] prior to dispensing any controlled drugs.   Patient presented to emergency department for evaluation of migraine.  Patient was agitated in the emergency department prior to my evaluation so someone in the ED had requested security presence.  While I was evaluating the patient, he saw security outside in the hallway and became increasingly agitated and began raising his voice.  Patient started approaching me, so I proceeded to open the door and stepped into the hallway.  Patient became upset that security was near his door and walked out of the emergency department.  Brett Santos was evaluated in Emergency Department on 03/14/2020 for the symptoms described in the history of present illness. He was evaluated in the context of the global COVID-19 pandemic, which necessitated consideration that the patient might be at risk for infection with the SARS-CoV-2 virus that causes COVID-19. Institutional protocols and algorithms that pertain to the evaluation of patients at risk for COVID-19 are in a state of rapid change based on information released by regulatory bodies including the CDC and federal and state organizations. These policies and algorithms were followed during the patient's care in the ED.    ____________________________________________  FINAL CLINICAL IMPRESSION(S) /  ED DIAGNOSES  Final diagnoses:  Other migraine without status migrainosus, intractable      NEW MEDICATIONS STARTED DURING THIS VISIT:  ED Discharge Orders    None          This chart was dictated using voice recognition software/Dragon. Despite best efforts to proofread, errors can occur which can change the meaning. Any change was purely unintentional.    Laban Emperor, PA-C 03/14/20 1505    Lilia Pro., MD 03/14/20 [phone]

## 2020-03-14 NOTE — ED Triage Notes (Signed)
Says migrain3e for couple hours.  History of same.  Brought by ems.  Vss.  [id number].  Says feels like migraine.  Was here a couple days ago and he has not been able to ge the rx yet.

## 2020-04-26 IMAGING — CT CT HEAD W/O CM
3 series · 15 of 46 positions shown, 18 images · non-contrast
Comparison: None.

CLINICAL DATA: L-sided headache starting 0700 today after slipping
in kitchen [REDACTED] night and hitting head on countertop. Denies LOC.
Denies blood thinner use

EXAM:
CT HEAD WITHOUT CONTRAST
TECHNIQUE: Contiguous axial images were obtained from the base of the skull
through the vertex without intravenous contrast.

[Series 2: head wo · axial · 0.42mm/px · z∈[+309,+429]mm · 9 of 29 slices shown, 12 images]
[im 3/29  brain]
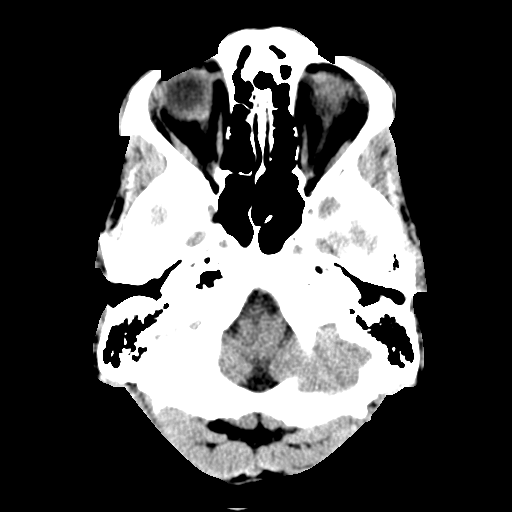
[im 3/29  bone]
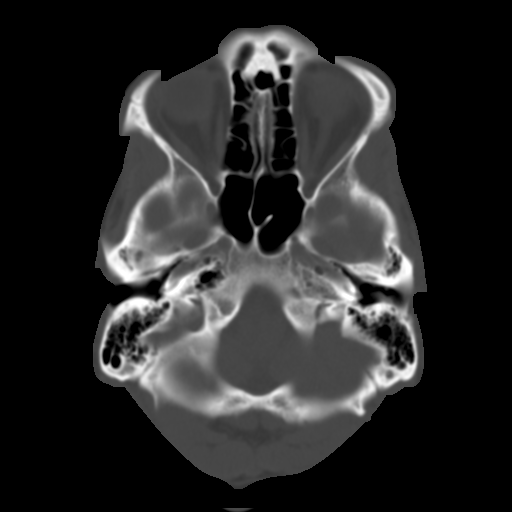
[im 6/29  brain]
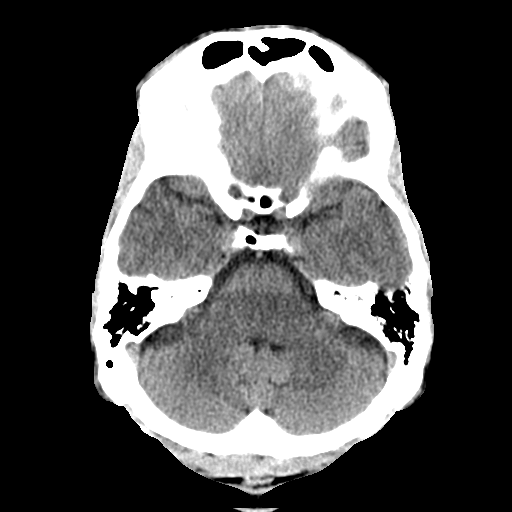
[im 9/29  brain]
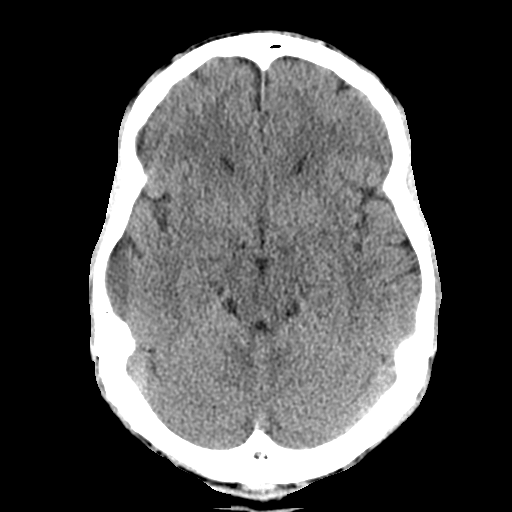
[im 12/29  brain]
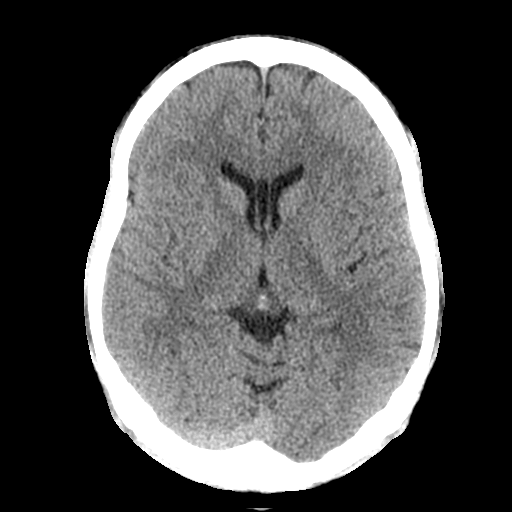
[im 15/29  brain]
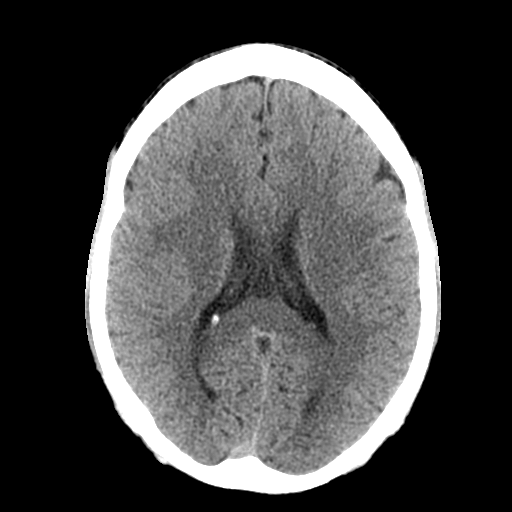
[im 15/29  bone]
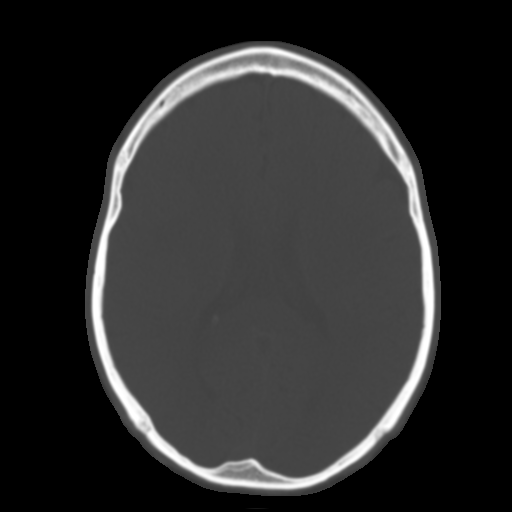
[im 18/29  brain]
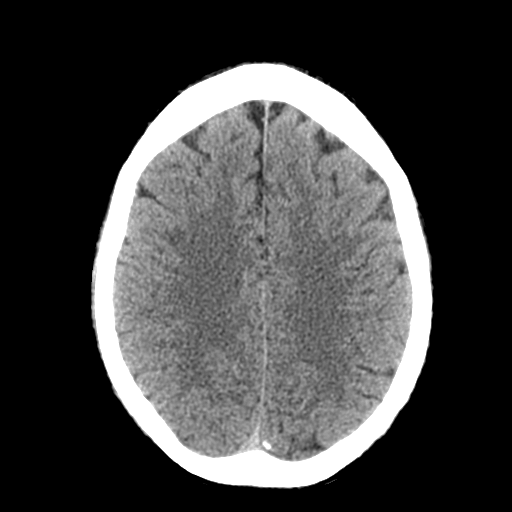
[im 21/29  brain]
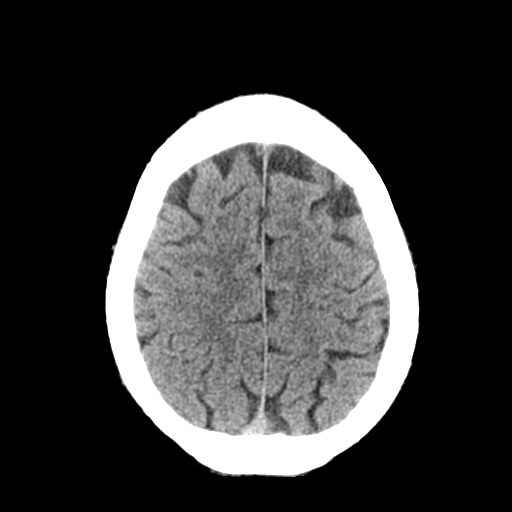
[im 24/29  brain]
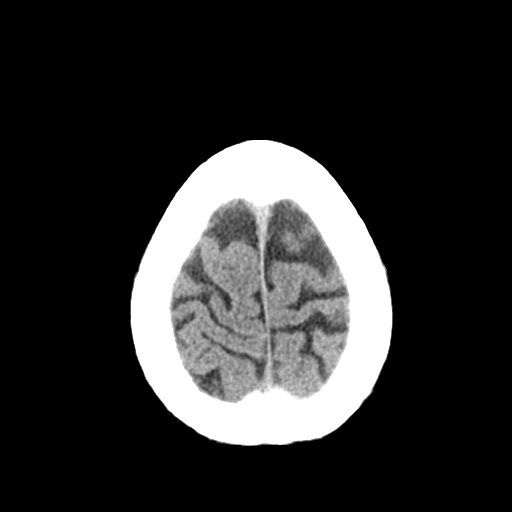
[im 27/29  brain]
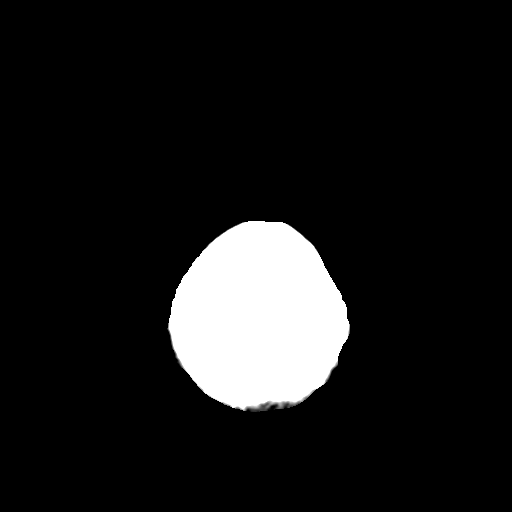
[im 27/29  bone]
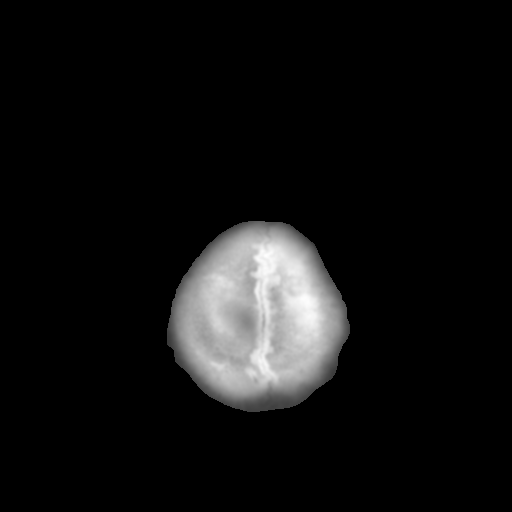

[Series 4: coronal soft tissue · coronal · 0.29mm/px · 3 of 68 slices shown]
[im 23/68  brain]
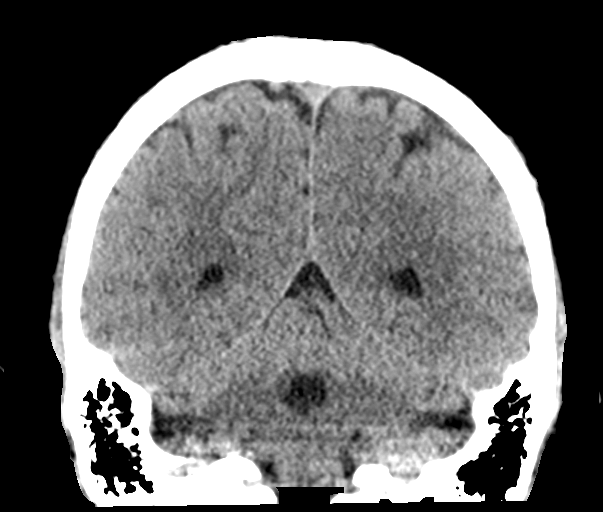
[im 30/68  brain]
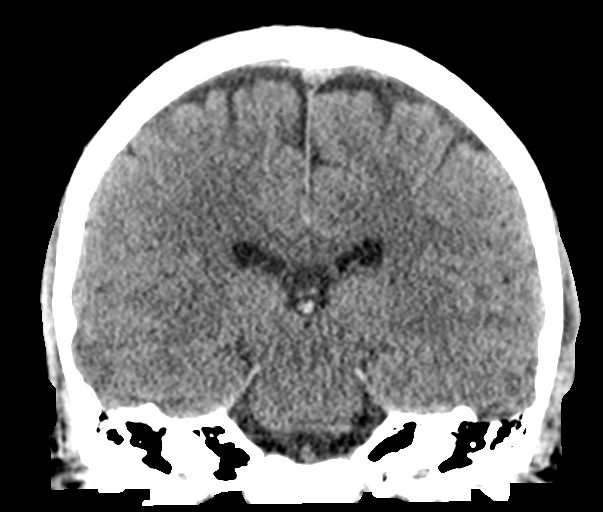
[im 38/68  brain]
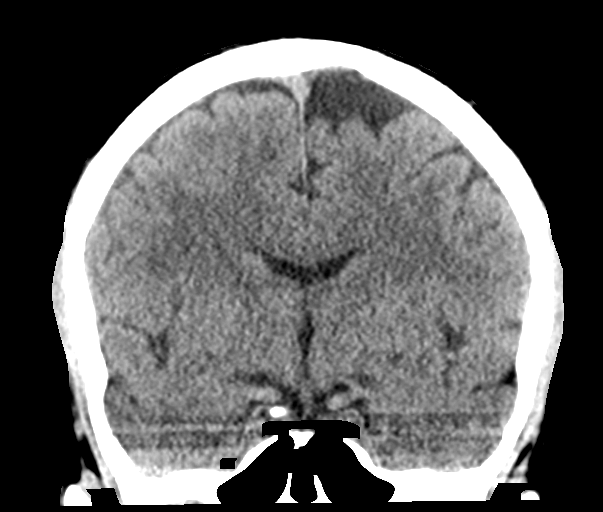

[Series 5: sagittal soft tissue · sagittal · 0.29mm/px · 3 of 59 slices shown]
[im 20/59  brain]
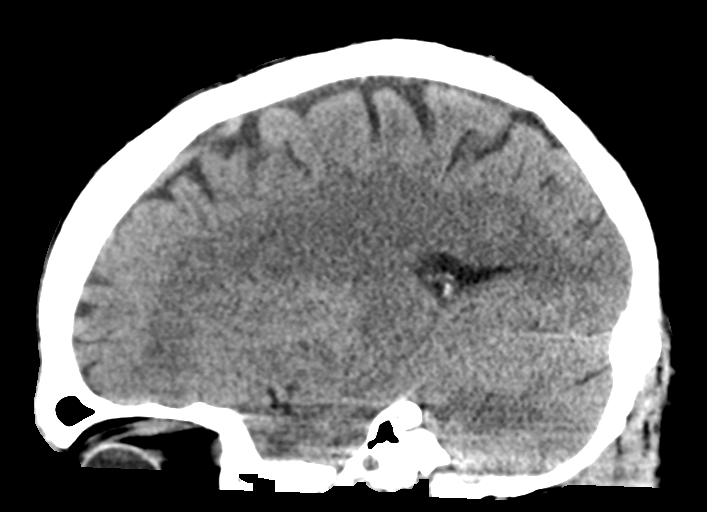
[im 30/59  brain]
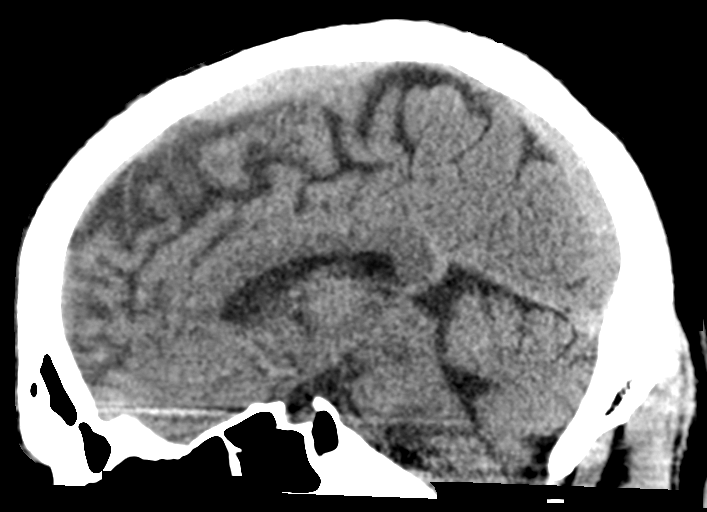
[im 39/59  brain]
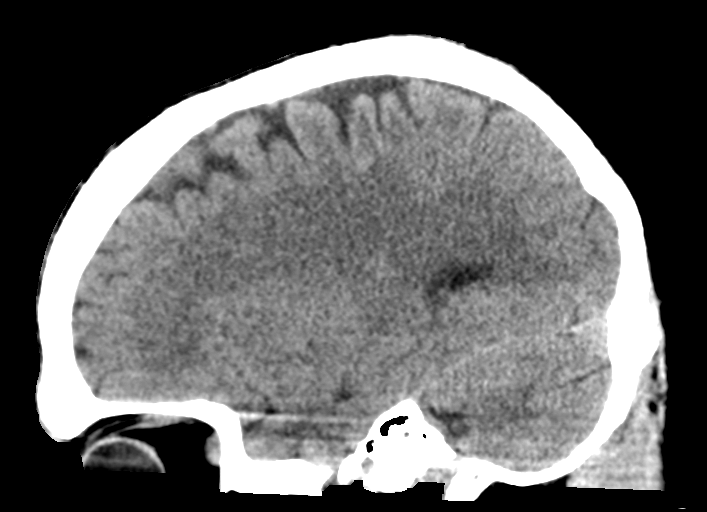

[15 of 46 positions shown; findings below may reference images not displayed]

FINDINGS: Brain: No evidence of acute infarction, hemorrhage, hydrocephalus,
extra-axial collection or mass lesion/mass effect.

Mild areas of subcortical white matter hypoattenuation are noted
consistent with chronic microvascular ischemic change.

Vascular: No hyperdense vessel or unexpected calcification.

Skull: Normal. Negative for fracture or focal lesion.

Sinuses/Orbits: Visualized globes and orbits are unremarkable. The
visualized sinuses are clear.

Other: None.
IMPRESSION: 1. No acute intracranial abnormalities.
2. Mild chronic microvascular ischemic change.

## 2020-06-26 ENCOUNTER — Other Ambulatory Visit: Payer: Self-pay | Admitting: Physician Assistant

## 2020-06-26 DIAGNOSIS — M25561 Pain in right knee: Secondary | ICD-10-CM

## 2020-06-26 DIAGNOSIS — G8929 Other chronic pain: Secondary | ICD-10-CM

## 2020-07-12 ENCOUNTER — Ambulatory Visit: Payer: Self-pay | Admitting: Urology

## 2020-07-12 ENCOUNTER — Encounter: Payer: Self-pay | Admitting: Urology

## 2020-07-19 ENCOUNTER — Encounter: Payer: Self-pay | Admitting: Urology

## 2020-07-19 ENCOUNTER — Ambulatory Visit: Payer: Self-pay | Admitting: Urology

## 2020-11-06 ENCOUNTER — Emergency Department
Admission: EM | Admit: 2020-11-06 | Discharge: 2020-11-06 | Disposition: A | Discharge status: Home / Self Care | Payer: Medicaid Other | Attending: Emergency Medicine | Admitting: Emergency Medicine

## 2020-11-06 ENCOUNTER — Emergency Department: Payer: Medicaid Other

## 2020-11-06 ENCOUNTER — Other Ambulatory Visit: Payer: Self-pay

## 2020-11-06 ENCOUNTER — Encounter: Payer: Self-pay | Admitting: Emergency Medicine

## 2020-11-06 DIAGNOSIS — R079 Chest pain, unspecified: Secondary | ICD-10-CM | POA: Insufficient documentation

## 2020-11-06 DIAGNOSIS — Z5321 Procedure and treatment not carried out due to patient leaving prior to being seen by health care provider: Secondary | ICD-10-CM | POA: Diagnosis not present

## 2020-11-06 LAB — CBC
HCT: 41 % (ref 39.0–52.0)
Hemoglobin: 13.7 g/dL (ref 13.0–17.0)
MCH: 30.7 pg (ref 26.0–34.0)
MCHC: 33.4 g/dL (ref 30.0–36.0)
MCV: 91.9 fL (ref 80.0–100.0)
Platelets: 224 10*3/uL (ref 150–400)
RBC: 4.46 MIL/uL (ref 4.22–5.81)
RDW: 13.8 % (ref 11.5–15.5)
WBC: 9.3 10*3/uL (ref 4.0–10.5)
nRBC: 0 % (ref 0.0–0.2)

## 2020-11-06 LAB — BASIC METABOLIC PANEL
Anion gap: 9 (ref 5–15)
BUN: 12 mg/dL (ref 6–20)
CO2: 26 mmol/L (ref 22–32)
Calcium: 9 mg/dL (ref 8.9–10.3)
Chloride: 103 mmol/L (ref 98–111)
Creatinine, Ser: 0.97 mg/dL (ref 0.61–1.24)
GFR, Estimated: >60 mL/min (ref >60)
Glucose, Bld: 94 mg/dL (ref 70–99)
Potassium: 3.6 mmol/L (ref 3.5–5.1)
Sodium: 138 mmol/L (ref 135–145)

## 2020-11-06 LAB — TROPONIN I (HIGH SENSITIVITY)
Troponin I (High Sensitivity): 5 ng/L (ref <18)
Troponin I (High Sensitivity): 6 ng/L (ref <18)

## 2020-11-06 NOTE — ED Triage Notes (Signed)
Pt to ED via EMS with c/o CP. Pt reports CP since approx 0700 this morning. Pt A&O x4 upon arrival to triage. Pt reports CP to L chest and radiates to L shoulder.

## 2020-12-24 IMAGING — CR DG CHEST 2V
1 series · 2 of 2 positions shown · non-contrast
Comparison: 04/10/2018

CLINICAL DATA: Left-sided chest pain. Eighteen pack-year smoking
history.

EXAM:
CHEST - 2 VIEW

[Series 1: dg chest 2 view · 0.14mm/px · 2 of 2 slices shown]
[im 1/2]
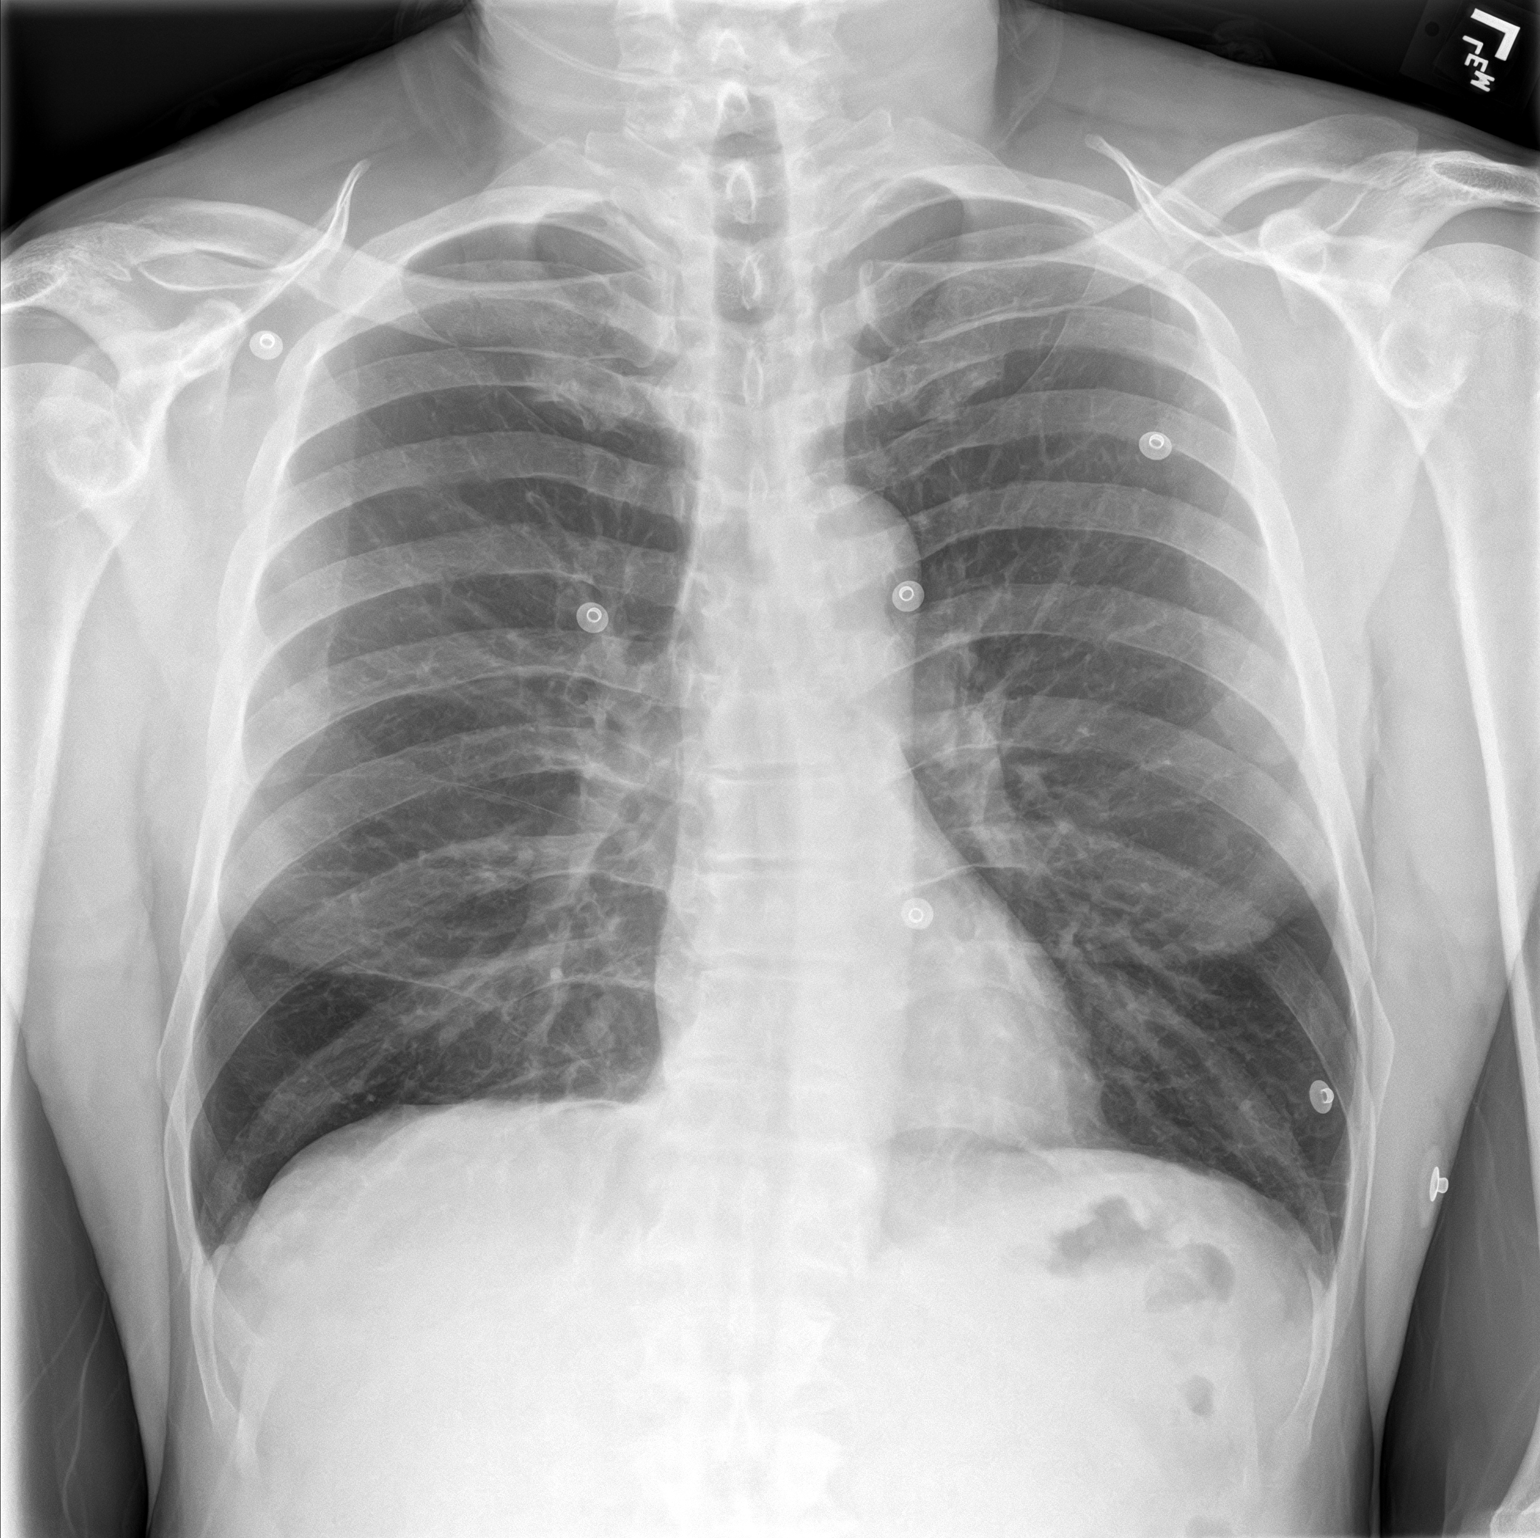
[im 2/2]
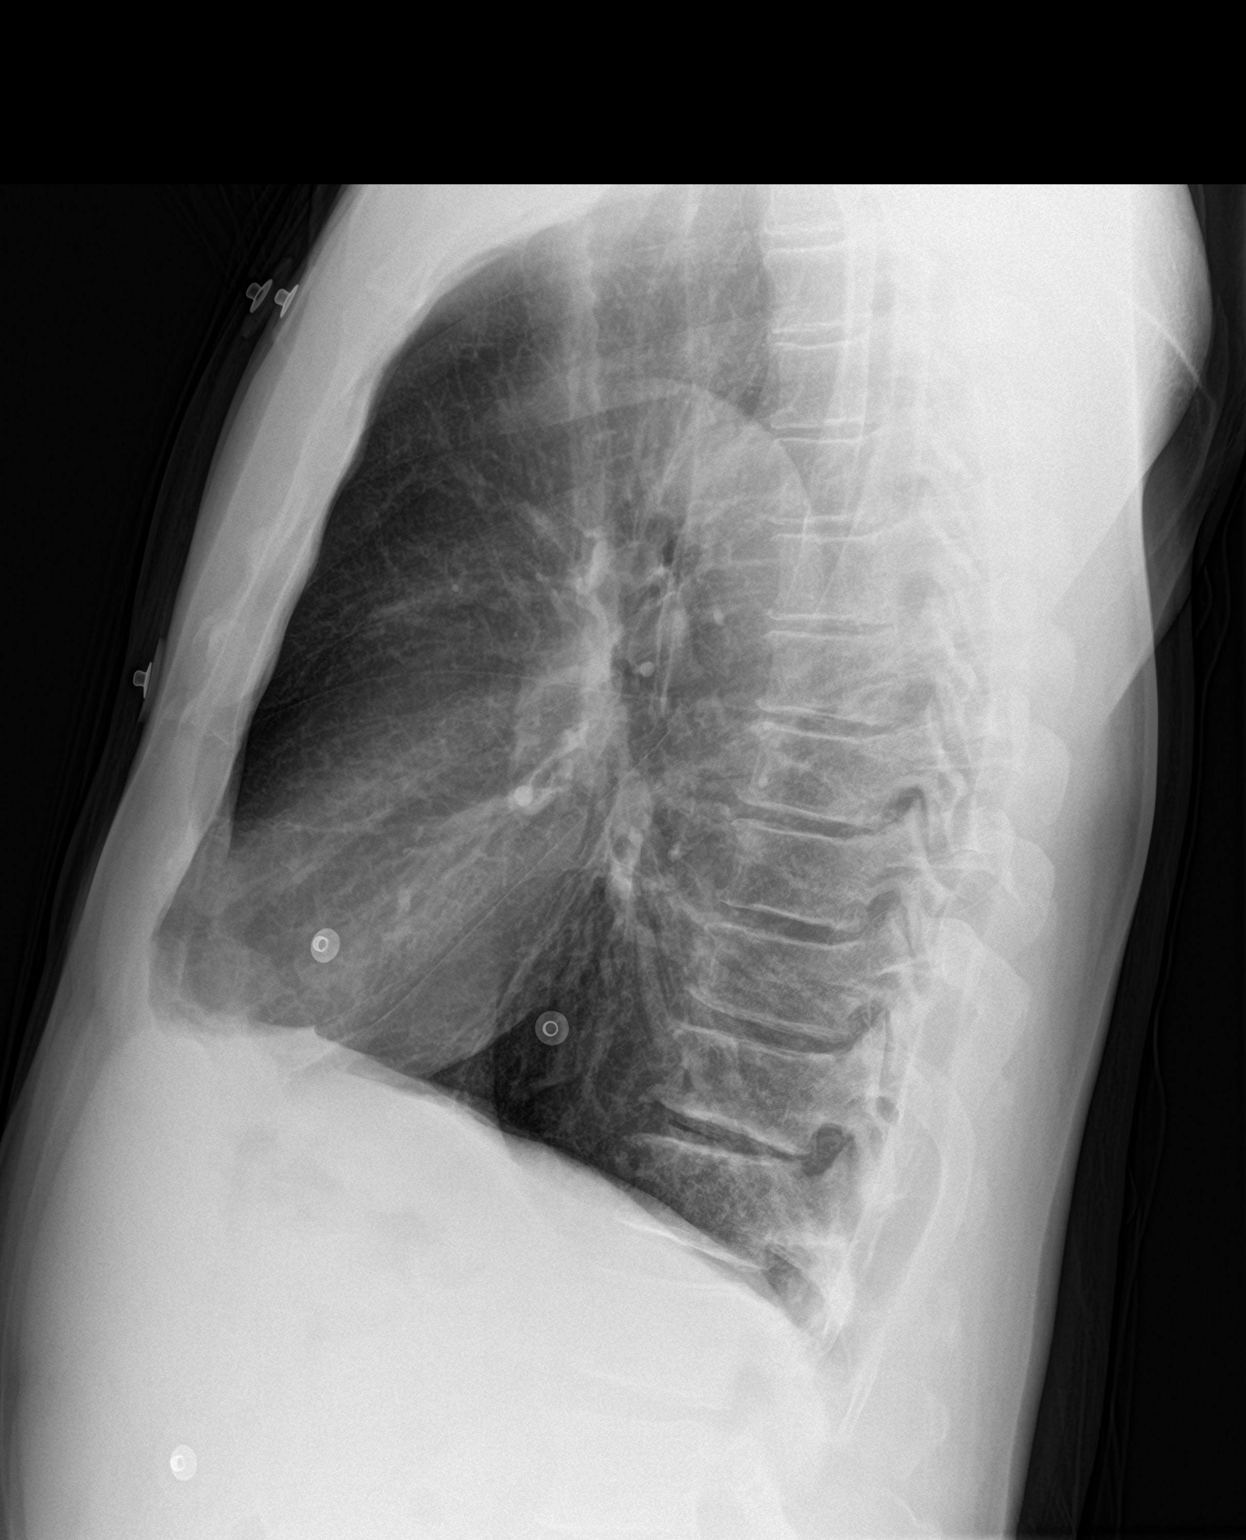

[2 of 2 positions shown; findings below may reference images not displayed]

FINDINGS: Mild hyperinflation. Midline trachea. Normal heart size and
mediastinal contours. No pleural effusion or pneumothorax. Diffuse
peribronchial thickening.
IMPRESSION: Hyperinflation and mild interstitial thickening, likely related to
clinical history of smoking.

No acute superimposed process.

## 2021-03-03 ENCOUNTER — Encounter: Payer: Self-pay | Admitting: Intensive Care

## 2021-03-03 ENCOUNTER — Emergency Department: Payer: Medicaid Other

## 2021-03-03 ENCOUNTER — Other Ambulatory Visit: Payer: Self-pay

## 2021-03-03 ENCOUNTER — Emergency Department
Admission: EM | Admit: 2021-03-03 | Discharge: 2021-03-03 | Disposition: A | Discharge status: Home / Self Care | Payer: Medicaid Other | Attending: Emergency Medicine | Admitting: Emergency Medicine

## 2021-03-03 DIAGNOSIS — Z79899 Other long term (current) drug therapy: Secondary | ICD-10-CM | POA: Diagnosis not present

## 2021-03-03 DIAGNOSIS — S76012A Strain of muscle, fascia and tendon of left hip, initial encounter: Secondary | ICD-10-CM

## 2021-03-03 DIAGNOSIS — E039 Hypothyroidism, unspecified: Secondary | ICD-10-CM | POA: Diagnosis not present

## 2021-03-03 DIAGNOSIS — M25551 Pain in right hip: Secondary | ICD-10-CM | POA: Insufficient documentation

## 2021-03-03 DIAGNOSIS — F17210 Nicotine dependence, cigarettes, uncomplicated: Secondary | ICD-10-CM | POA: Insufficient documentation

## 2021-03-03 DIAGNOSIS — S76011A Strain of muscle, fascia and tendon of right hip, initial encounter: Secondary | ICD-10-CM

## 2021-03-03 DIAGNOSIS — M25552 Pain in left hip: Secondary | ICD-10-CM | POA: Diagnosis not present

## 2021-03-03 HISTORY — DX: Unspecified osteoarthritis, unspecified site: M19.90

## 2021-03-03 MED ORDER — PREDNISONE 20 MG PO TABS
60.0000 mg | ORAL_TABLET | Freq: Once | ORAL | Status: AC
  Administered 2021-03-03: 60 mg via ORAL
  Filled 2021-03-03: qty 3

## 2021-03-03 MED ORDER — CYCLOBENZAPRINE HCL 10 MG PO TABS
5.0000 mg | ORAL_TABLET | Freq: Once | ORAL | Status: AC
  Administered 2021-03-03: 5 mg via ORAL
  Filled 2021-03-03: qty 1

## 2021-03-03 NOTE — ED Provider Notes (Signed)
[facility] Emergency Department Provider Note ____________________________________________  Time seen: 0830  I have reviewed the triage vital signs and the nursing notes.  HISTORY  Chief Complaint  Hip Pain   HPI Brett Santos is a 60 y.o. male presents to the ER today with complaint of bilateral hip pain.  He reports this started on Wednesday after trying to do some exercises in his bed.  He reports he was trying to do bilateral leg lifts so that he can work on his core.  He describes the pain as sore, achy, sharp and stabbing.  The pain does not radiate.  He reports some associated muscle tension and strain in his groin.  He denies low back pain, numbness, tingling or weakness of his lower extremities.  He denies prior hip pain or surgery.  He has not taken any medications OTC for this but was recently prescribed Meloxicam and Flexeril by his PCP although he has been able to pick this up for financial reasons.  Past Medical History:  Diagnosis Date  . Degenerative arthritis   . Elevated PSA   . Headache   . Lower back pain    per patient  . Migraine   . Osteoarthritis   . Thyroid disease     Patient Active Problem List   Diagnosis Date Noted  . Pre-diabetes 01/14/2017  . Urinary hesitancy 01/14/2017  . Hypothyroidism 01/04/2016    Past Surgical History:  Procedure Laterality Date  . COLONOSCOPY    . COLONOSCOPY WITH PROPOFOL N/A 09/06/2019   Procedure: COLONOSCOPY WITH PROPOFOL;  Surgeon: Jonathon Bellows, MD;  Location: [facility] ENDOSCOPY;  Service: Gastroenterology;  Laterality: N/A;    Prior to Admission medications   Medication Sig Start Date End Date Taking? Authorizing Provider  levothyroxine (SYNTHROID, LEVOTHROID) 100 MCG tablet TAKE 1 TABLET BY MOUTH ONCE DAILY BEFORE BREAKFAST 06/05/18   McGowan, Larene Beach A, PA-C  terazosin (HYTRIN) 1 MG capsule Take 1 capsule (1 mg total) by mouth at bedtime. 01/14/17   Birdie Sons, MD     Allergies Patient has no known allergies.  Family History  Problem Relation Age of Onset  . Diabetes type I Mother   . Diabetes Father   . Leukemia Father     Social History Social History   Tobacco Use  . Smoking status: Current Every Day Smoker    Packs/day: 0.50    Years: 35.00    Pack years: 17.50  . Smokeless tobacco: Never Used  Vaping Use  . Vaping Use: Never used  Substance Use Topics  . Alcohol use: Yes    Comment: rarely  . Drug use: Not Currently    Review of Systems  Constitutional: Negative for fever, chills or body aches. Cardiovascular: Negative for chest pain or chest tightness. Respiratory: Negative for cough or shortness of breath. Musculoskeletal: Positive for bilateral hip pain, difficulty with gait.  Negative for low back pain. Skin: Negative for rash. Neurological: Negative for focal weakness, tingling or numbness. ____________________________________________  PHYSICAL EXAM:  VITAL SIGNS: ED Triage Vitals  Enc Vitals Group     BP 03/03/21 0806 100/70     Pulse Rate 03/03/21 0806 83     Resp 03/03/21 0806 16     Temp 03/03/21 0806 98.4 F (36.9 C)     Temp Source 03/03/21 0806 Oral     SpO2 03/03/21 0806 97 %     Weight 03/03/21 0800 180 lb (81.6 kg)     Height 03/03/21 0800 6' (1.829  m)     Head Circumference --      Peak Flow --      Pain Score 03/03/21 0800 10     Pain Loc --      Pain Edu? --      Excl. in [place]? --     Constitutional: Alert and oriented. Well appearing and in no distress. Head: Normocephalic.. Eyes:  Normal extraocular movements Cardiovascular: Normal rate, regular rhythm.  Pedal pulses 2+ bilaterally Respiratory: Normal respiratory effort. No wheezes/rales/rhonchi noted. Musculoskeletal: Normal flexion, extension, rotation lateral bending of the spine.  No bony tenderness noted over the spine.  Decreased adduction, internal and external rotation of bilateral hips.  Normal flexion, extension, and abduction  of bilateral hips.  No bony tenderness noted over the hips.  Strength 5/5 BLE.  Using a cane for assistance with gait which he was using prior to onset of this hip pain. Neurologic: Normal speech and language. No gross focal neurologic deficits are appreciated. Skin:  Skin is warm, dry and intact. No rash noted. ____________________________________________   RADIOLOGY  Imaging Orders     DG HIP UNILAT WITH PELVIS 2-3 VIEWS LEFT     DG HIP UNILAT WITH PELVIS 2-3 VIEWS RIGHT   IMPRESSION: Negative.   IMPRESSION: Negative.   ____________________________________________   INITIAL IMPRESSION / ASSESSMENT AND PLAN / ED COURSE  Bilateral Hip Pain:  DDx include osteoarthritis of the hips, muscle strain of the hips. Xray left hip negative for acute findings Xray right hip negative for acute findings Prednisone 60 mg PO x 1 Flexeril 5 mg PO x 1 Encouraged heat, stretching and avoid repetitive motions at this time Hip exercises given Advised him to pick up his Meloxicam and Flexeril that is already at the pharmacy as soon as he can ____________________________________________  FINAL CLINICAL IMPRESSION(S) / ED DIAGNOSES  Final diagnoses:  Hip pain, bilateral      Jearld Fenton, NP 03/03/21 0950    Lucrezia Starch, MD 03/03/21 1530

## 2021-03-03 NOTE — ED Triage Notes (Signed)
C/o bilateral hip pain. No injury. Hx osteoarthritis.

## 2021-03-03 NOTE — Discharge Instructions (Addendum)
You were seen today for bilateral hip pain.  The x-rays of your bilateral hips were negative for arthritis or any acute findings.  You have been diagnosed with a muscle strain of your bilateral hips.  I am giving you hip exercises to do to help stretch the area.  Please avoid overuse or repetitive movements of the hips at this time.  You did receive a dose of steroid and muscle relaxer in the ER.  Please go to the pharmacy and pick up your Meloxicam and Flexeril that was previously prescribed.  Please follow-up with your PCP if symptoms persist or worsen.

## 2021-03-03 NOTE — ED Notes (Signed)
See triage note  Presents with bilateral hip pain  States he did some leg lifts and crunches prior to having pain  States he has rx to pick up at drug store  Having increased pain with standing

## 2021-04-20 IMAGING — CR DG HIP (WITH OR WITHOUT PELVIS) 2-3V*R*
1 series · 3 of 3 positions shown · non-contrast
Comparison: None.

CLINICAL DATA: Bilateral hip pain post exercise.

EXAM:
DG HIP (WITH OR WITHOUT PELVIS) 2-3V RIGHT

[Series 1: dg hip unilat w or w/o pelvis 2-3 views  · non-contrast · 0.14mm/px · 3 of 3 slices shown]
[im 1/3]
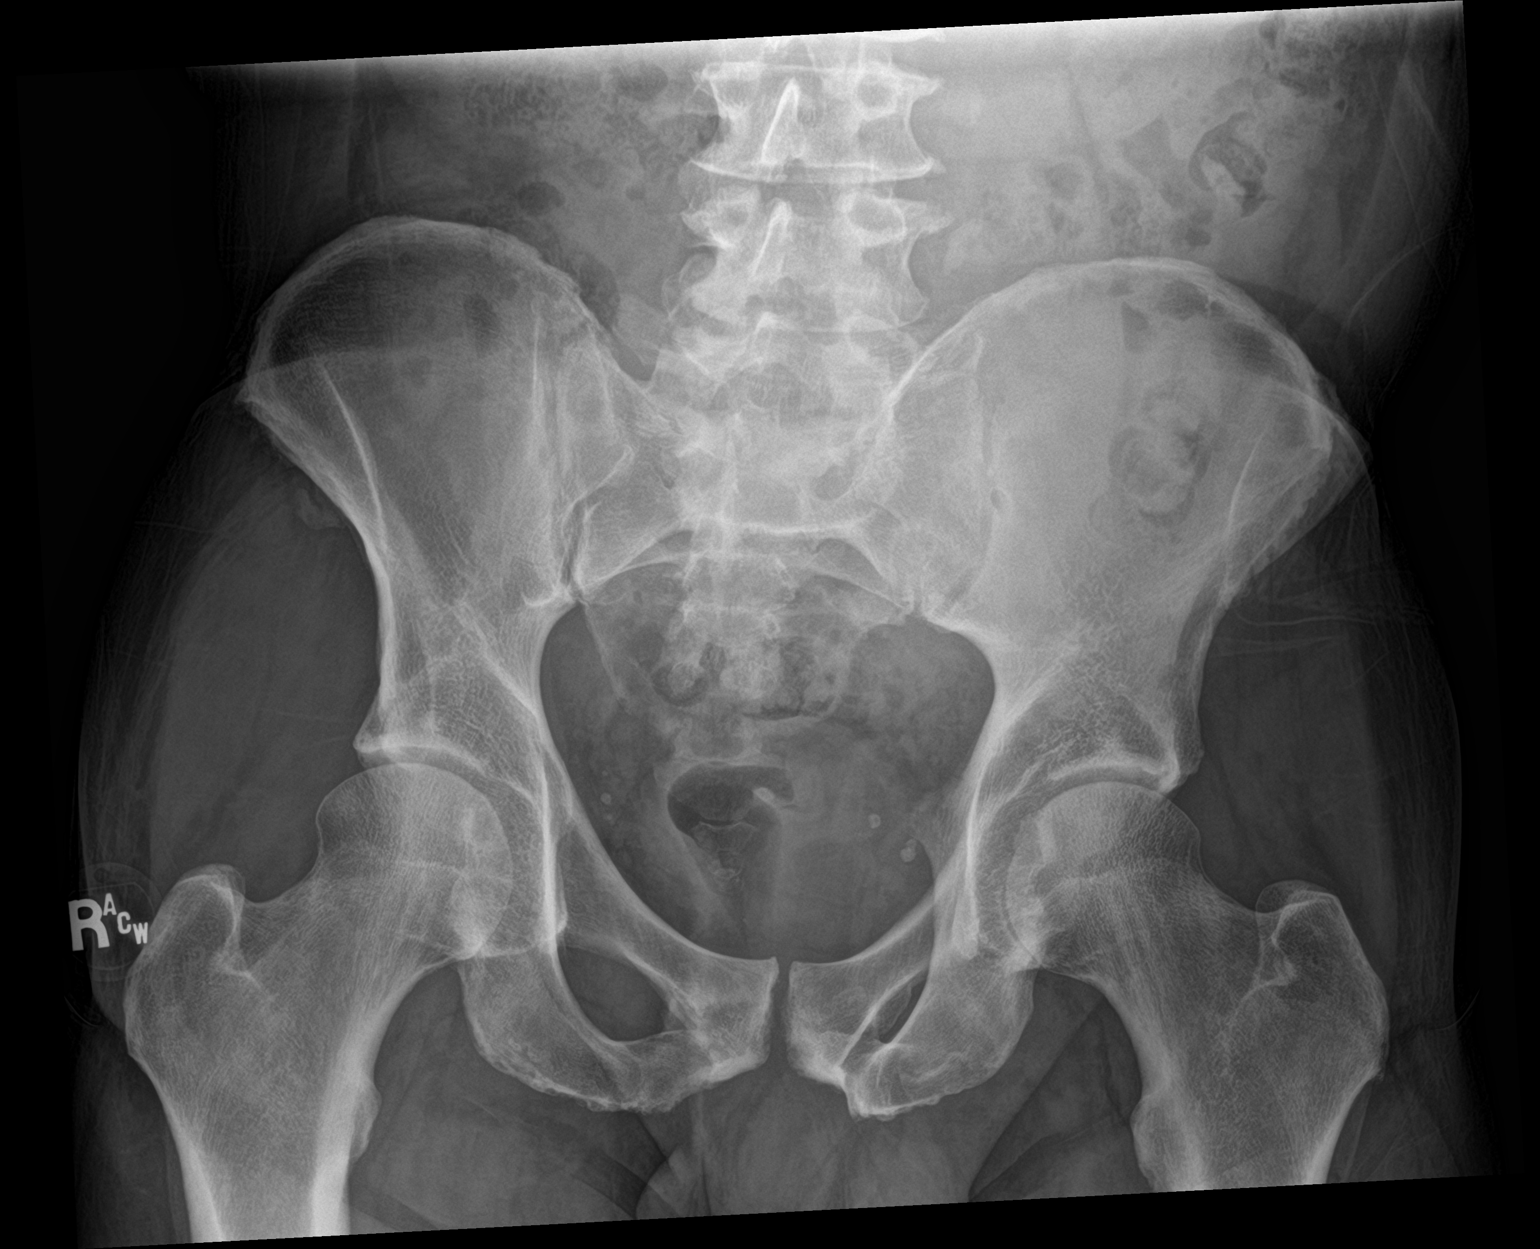
[im 2/3]
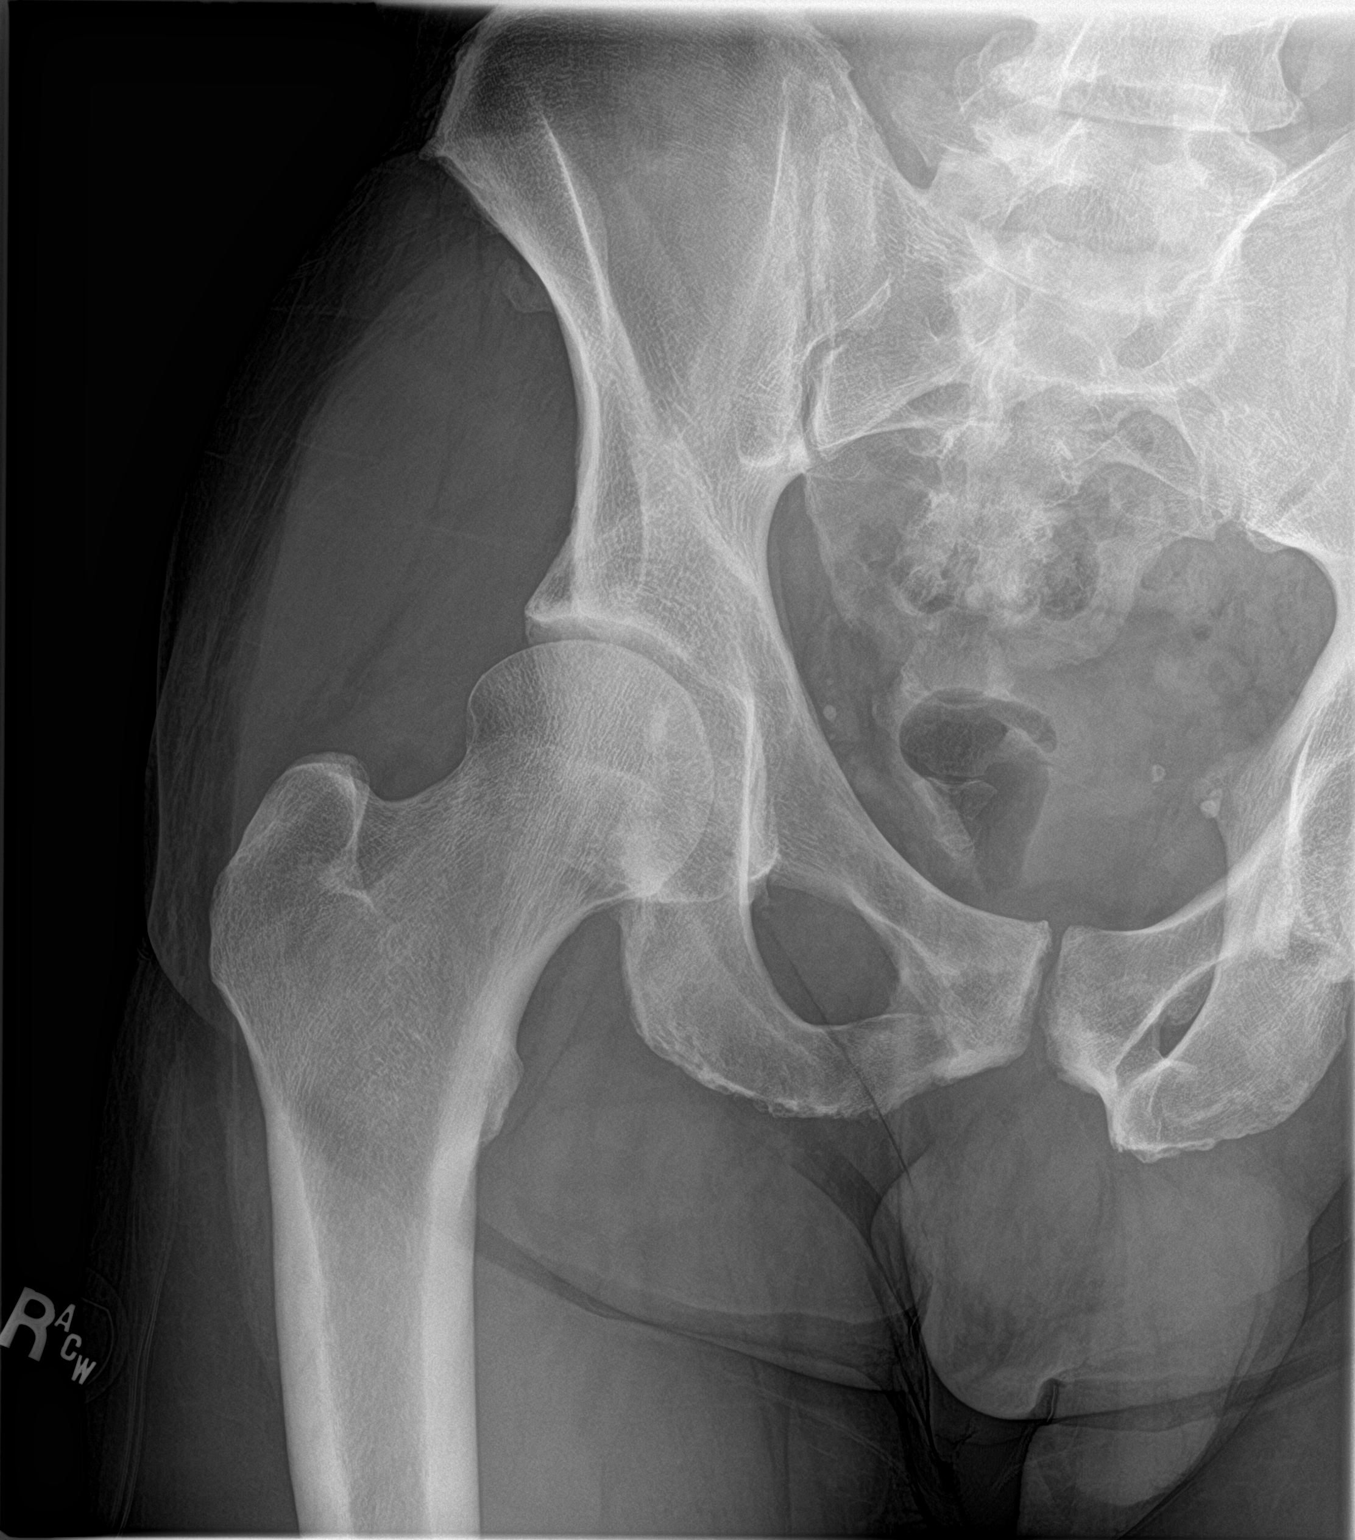
[im 3/3]
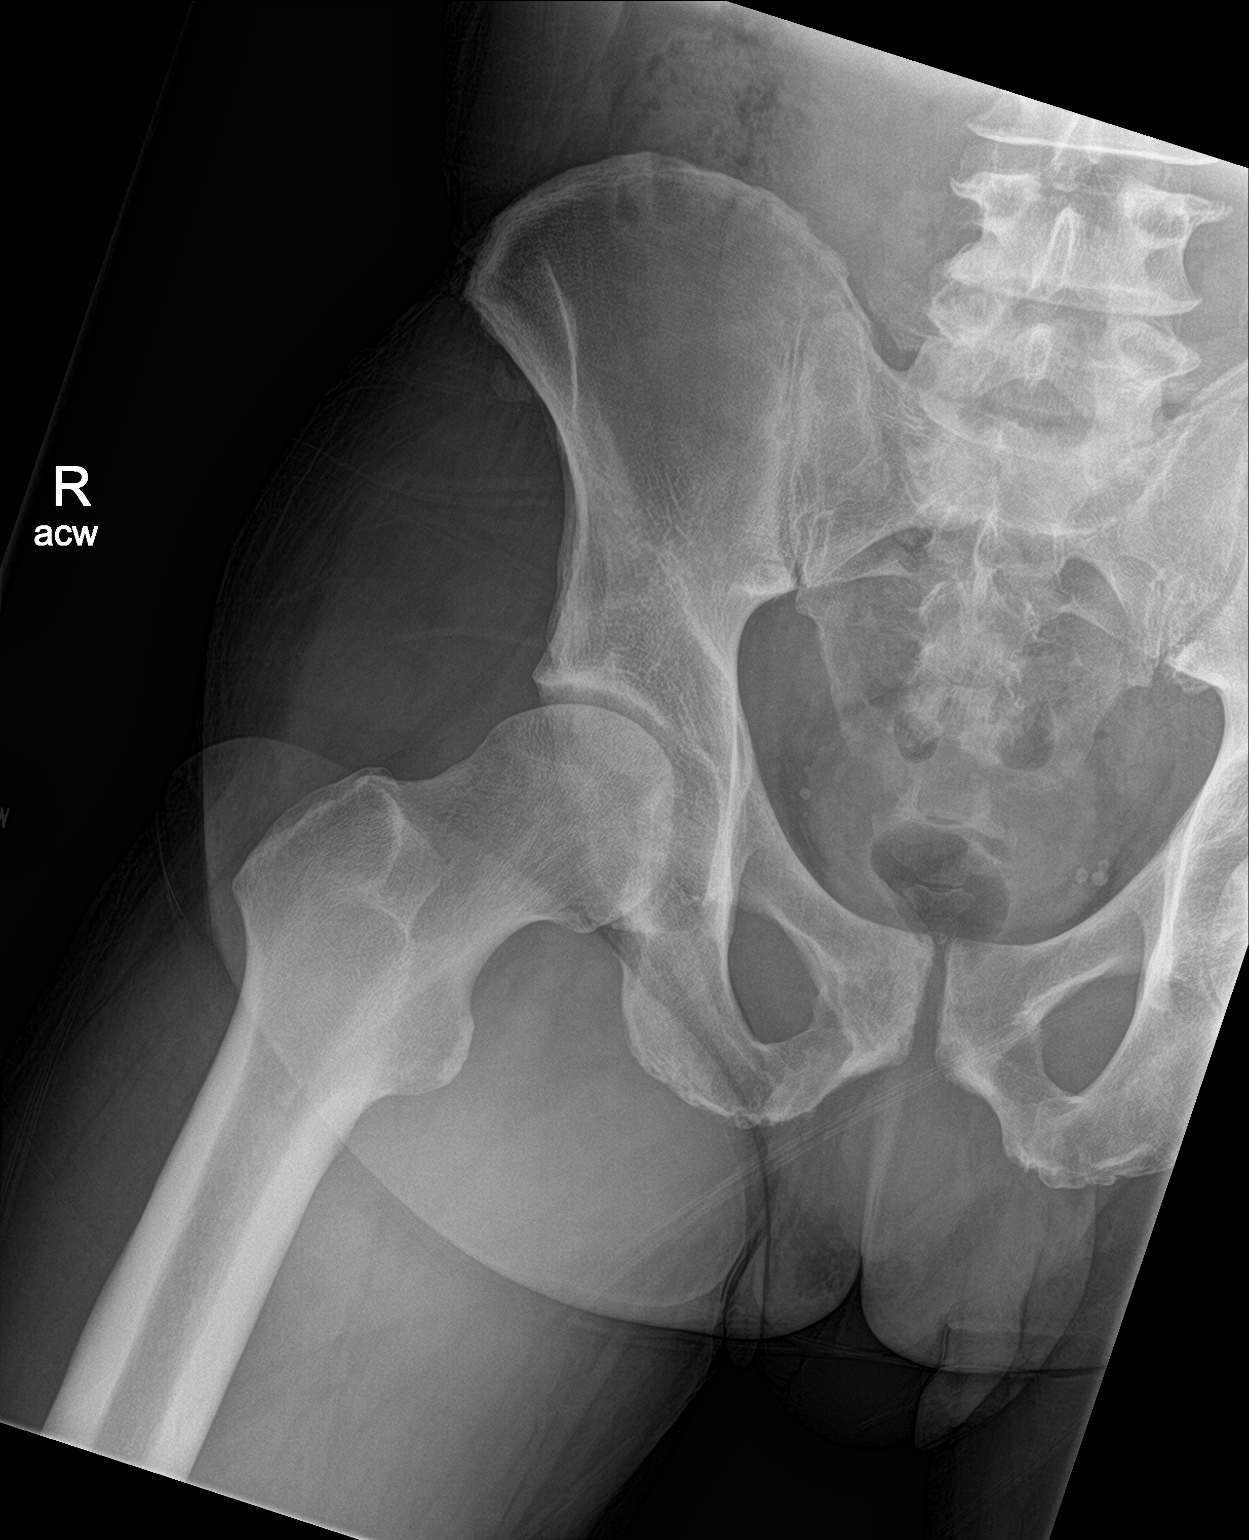

[3 of 3 positions shown; findings below may reference images not displayed]

FINDINGS: There is no evidence of hip fracture or dislocation. No significant
degenerative joint changes is identified right hip.
IMPRESSION: Negative.

## 2021-04-20 IMAGING — CR DG HIP (WITH OR WITHOUT PELVIS) 2-3V*L*
1 series · 3 of 3 positions shown · non-contrast
Comparison: None.

CLINICAL DATA: Bilateral hip pain

EXAM:
DG HIP (WITH OR WITHOUT PELVIS) 2-3V LEFT

[Series 1: dg hip unilat w or w/o pelvis 2-3 views  · non-contrast · 0.14mm/px · 3 of 3 slices shown]
[im 1/3]
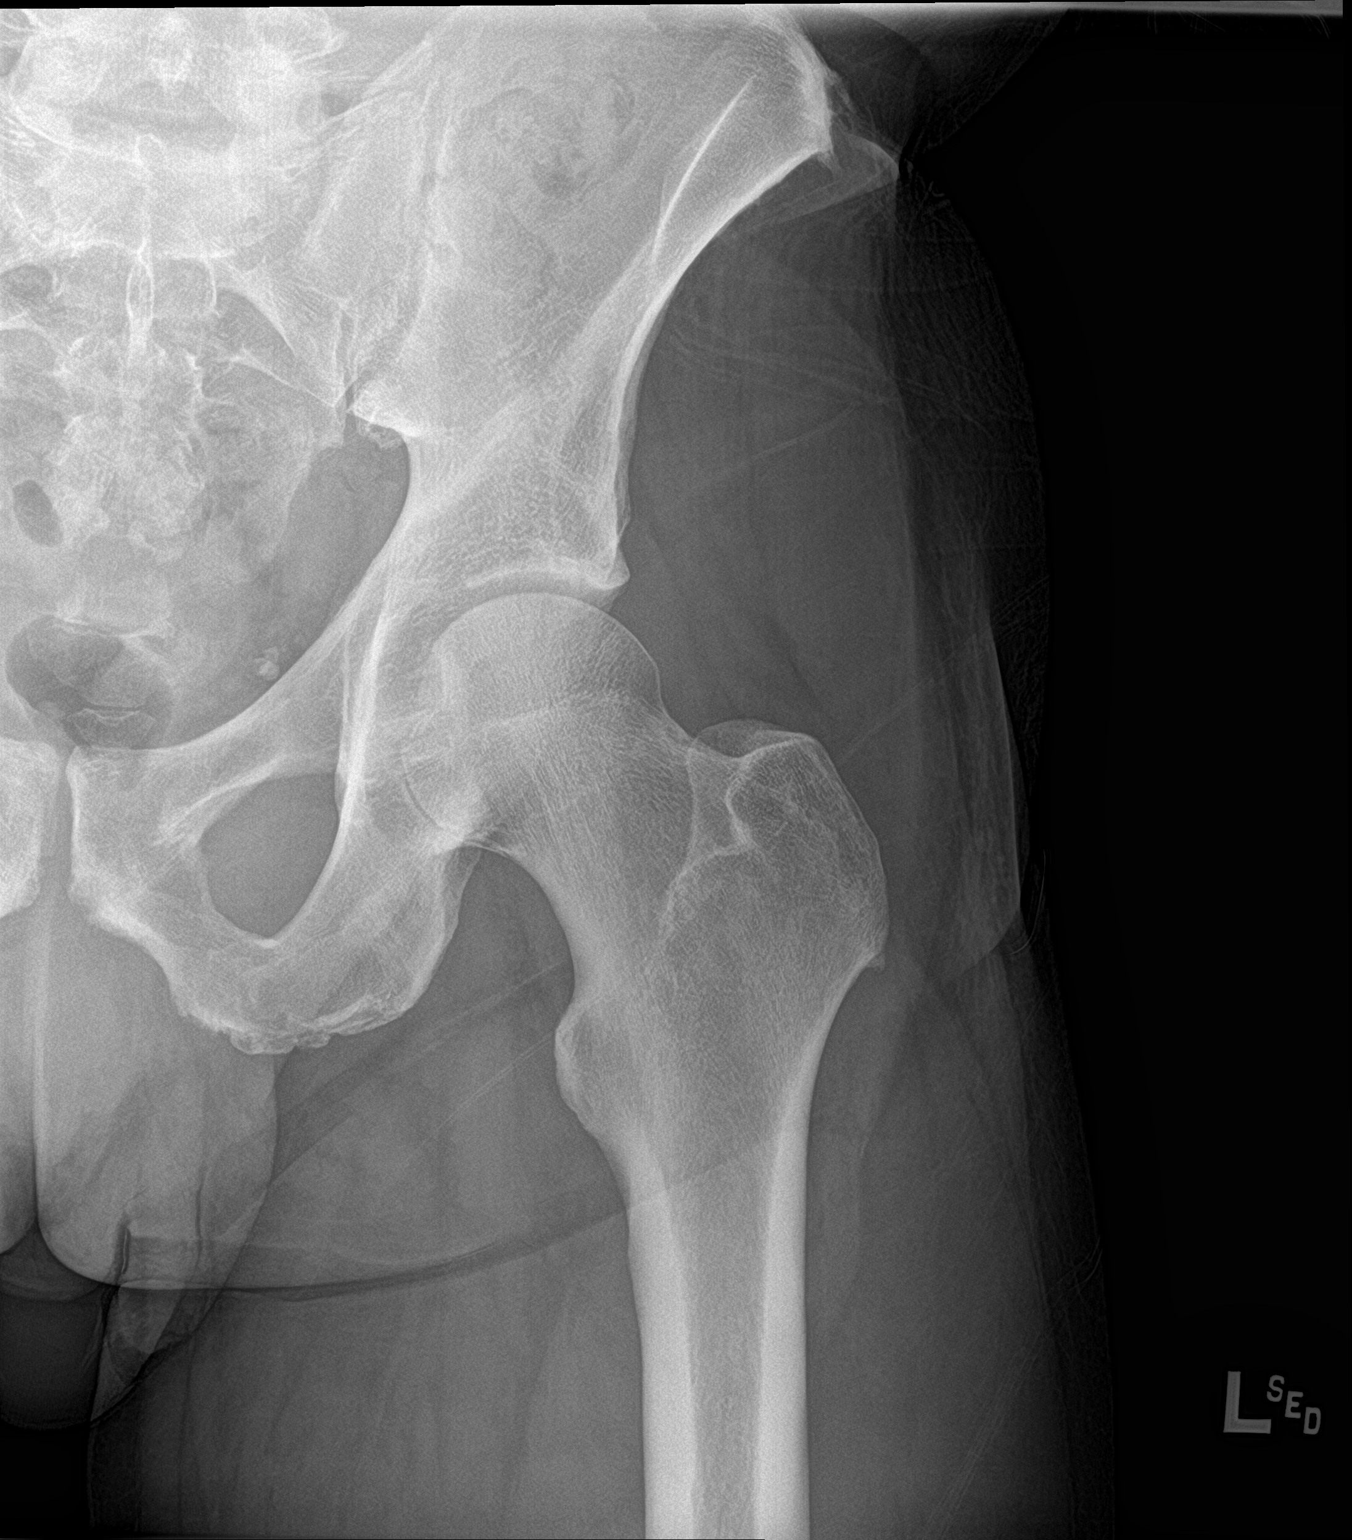
[im 2/3]
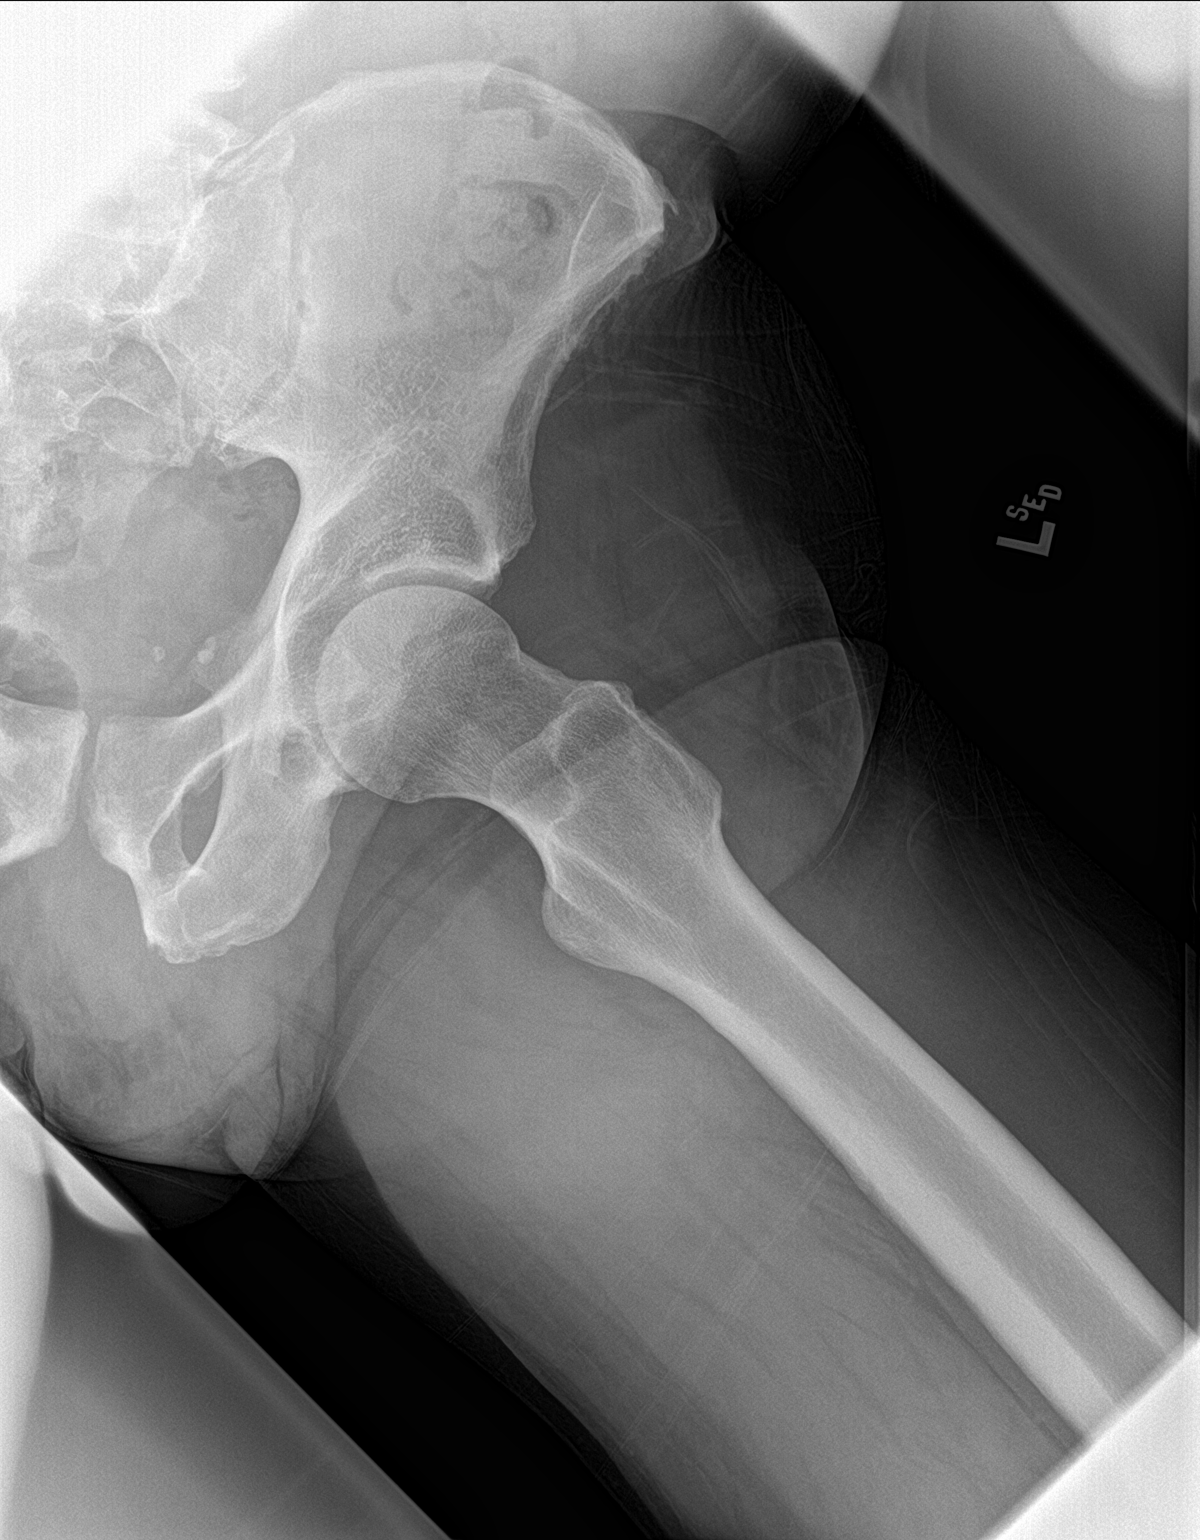
[im 3/3]
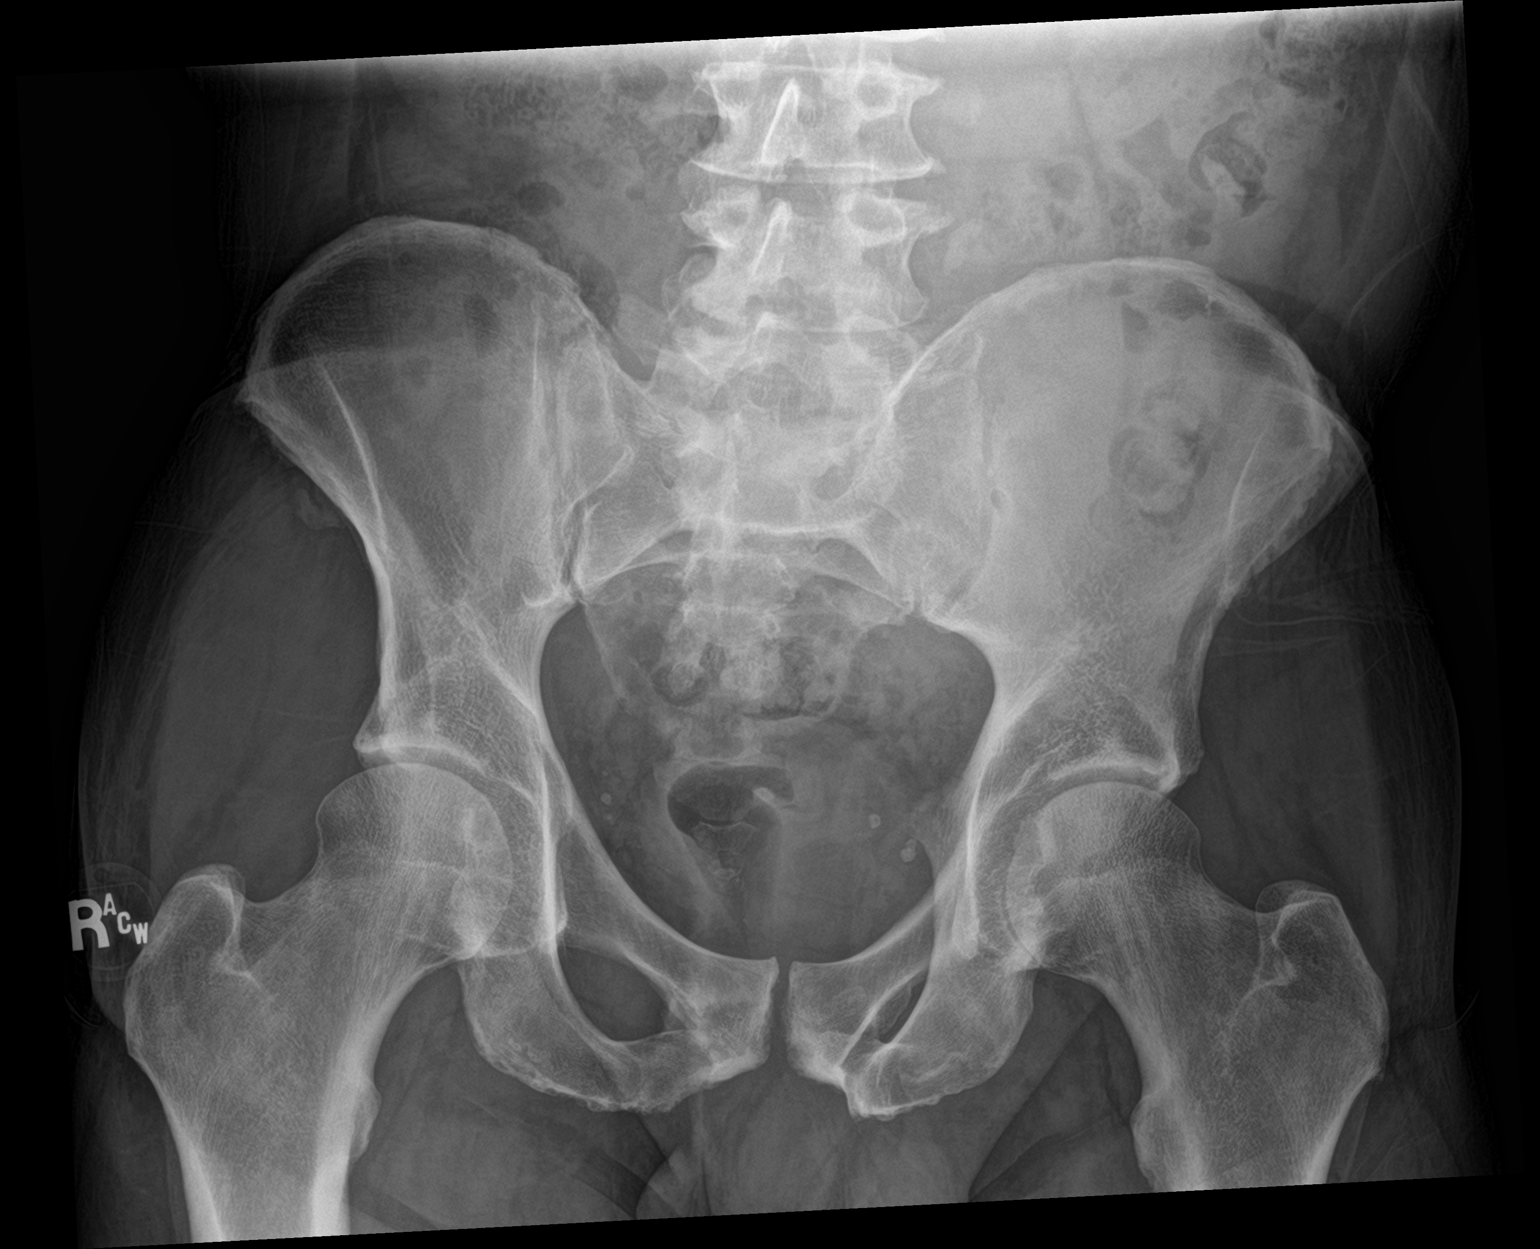

[3 of 3 positions shown; findings below may reference images not displayed]

FINDINGS: There is no evidence of hip fracture or dislocation. No significant
degenerative joint changes identified in bilateral hips.
IMPRESSION: Negative.

## 2021-05-04 ENCOUNTER — Other Ambulatory Visit: Payer: Self-pay

## 2021-05-04 ENCOUNTER — Ambulatory Visit
Admission: EM | Admit: 2021-05-04 | Discharge: 2021-05-04 | Disposition: A | Discharge status: Home / Self Care | Payer: Medicaid Other | Attending: Family Medicine | Admitting: Family Medicine

## 2021-05-04 DIAGNOSIS — H1032 Unspecified acute conjunctivitis, left eye: Secondary | ICD-10-CM

## 2021-05-04 NOTE — Discharge Instructions (Addendum)
Apply 1-2 drops every 2-3 hours while awake for the next 5 days.  Take care  Dr. Lacinda Axon

## 2021-05-04 NOTE — ED Triage Notes (Signed)
Patient complains of left eye pain that started on Tuesday after doing landscaping. States that he woke up this morning with his eye swollen, and draining pus.

## 2021-05-04 NOTE — ED Provider Notes (Signed)
[facility]    CSN: [id number] Arrival date & time: 05/04/21  0910      History   Chief Complaint Chief Complaint  Patient presents with   Eye Pain    left   HPI  60 year old male presents with the above complaint.  Patient states that he is concerned that he may have gotten something in his left eye on Tuesday after doing some landscaping.  He was not wearing eye protection.  Patient states that over the next few days he developed left eye redness.  He states that it was red and was draining this morning.  Minimal pain.  No vision changes.  He states that he does not feel a foreign body or have a foreign body sensation.  No medications or interventions tried.  No other reported symptoms.    Past Medical History:  Diagnosis Date   Degenerative arthritis    Elevated PSA    Headache    Lower back pain    per patient   Migraine    Osteoarthritis    Thyroid disease     Patient Active Problem List   Diagnosis Date Noted   Pre-diabetes 01/14/2017   Urinary hesitancy 01/14/2017   Hypothyroidism 01/04/2016    Past Surgical History:  Procedure Laterality Date   COLONOSCOPY     COLONOSCOPY WITH PROPOFOL N/A 09/06/2019   Procedure: COLONOSCOPY WITH PROPOFOL;  Surgeon: Jonathon Bellows, MD;  Location: [facility] ENDOSCOPY;  Service: Gastroenterology;  Laterality: N/A;       Home Medications    Prior to Admission medications   Medication Sig Start Date End Date Taking? Authorizing Provider  levothyroxine (SYNTHROID) 137 MCG tablet Take 137 mcg by mouth daily. 01/22/21  Yes [provider]  terazosin (HYTRIN) 10 MG capsule Take by mouth. 04/26/21  Yes [provider]    Family History Family History  Problem Relation Age of Onset   Diabetes type I Mother    Diabetes Father    Leukemia Father     Social History Social History   Tobacco Use   Smoking status: Every Day    Packs/day: 0.50    Years: 35.00    Pack years: 17.50    Types: Cigarettes    Smokeless tobacco: Never  Vaping Use   Vaping Use: Never used  Substance Use Topics   Alcohol use: Yes    Comment: rarely   Drug use: Not Currently    Comment: recovering crack addict-clean     Allergies   Patient has no known allergies.   Review of Systems Review of Systems  Eyes:  Positive for discharge and redness.    Physical Exam Triage Vital Signs ED Triage Vitals  Enc Vitals Group     BP 05/04/21 0924 116/74     Pulse Rate 05/04/21 0924 90     Resp 05/04/21 0924 18     Temp 05/04/21 0924 98.2 F (36.8 C)     Temp Source 05/04/21 0924 Oral     SpO2 05/04/21 0924 99 %     Weight 05/04/21 0922 185 lb (83.9 kg)     Height 05/04/21 0922 6' (1.829 m)     Head Circumference --      Peak Flow --      Pain Score 05/04/21 0922 1     Pain Loc --      Pain Edu? --      Excl. in [place]? --    Updated Vital Signs BP 116/74 (BP  Location: Left Arm)   Pulse 90   Temp 98.2 F (36.8 C) (Oral)   Resp 18   Ht 6' (1.829 m)   Wt 83.9 kg   SpO2 99%   BMI 25.09 kg/m   Visual Acuity Right Eye Distance:   Left Eye Distance:   Bilateral Distance:    Right Eye Near:   Left Eye Near:    Bilateral Near:     Physical Exam Vitals and nursing note reviewed.  Constitutional:      General: He is not in acute distress.    Appearance: Normal appearance. He is not ill-appearing.  HENT:     Head: Normocephalic and atraumatic.  Eyes:      Comments: Left eye with mild conjunctival injection.  No appreciable foreign body.  Fluorescein exam with some mild uptake of the labeled location.   Pulmonary:     Effort: Pulmonary effort is normal. No respiratory distress.  Neurological:     Mental Status: He is alert.  Psychiatric:        Mood and Affect: Mood normal.        Behavior: Behavior normal.     UC Treatments / Results  Labs (all labs ordered are listed, but only abnormal results are displayed) Labs Reviewed - No data to display  EKG   Radiology No results  found.  Procedures Procedures (including critical care time)  Medications Ordered in UC Medications - No data to display  Initial Impression / Assessment and Plan / UC Course  I have reviewed the triage vital signs and the nursing notes.  Pertinent labs & imaging results that were available during my care of the patient were reviewed by me and considered in my medical decision making (see chart for details).    60 year old male presents with conjunctivitis.  No appreciable foreign body.  Treating with sulfacetamide.  This was given to him in the clinic today (patient does not have transportation and got here on foot; therefore medication was given to him from our medical supplyso that he did not have to walk to the pharmacy given the current hot temperatures here).  Final Clinical Impressions(s) / UC Diagnoses   Final diagnoses:  Acute bacterial conjunctivitis of left eye     Discharge Instructions      Apply 1-2 drops every 2-3 hours while awake for the next 5 days.  Take care  Dr. Lacinda Axon    ED Prescriptions   None    PDMP not reviewed this encounter.   Coral Spikes, DO 05/04/21 1003

## 2021-05-08 ENCOUNTER — Ambulatory Visit: Payer: Medicaid Other | Admitting: Urology

## 2021-05-25 ENCOUNTER — Ambulatory Visit: Payer: Medicaid Other | Admitting: Urology

## 2021-06-26 NOTE — Progress Notes (Incomplete)
06/26/21 5:56 PM   Brett Santos [birth year] [id number]  Referring provider:  Crissie Figures, PA-C [place],  [place] [address] No chief complaint on file.    HPI: Brett Santos is a 60 y.o.male who presents today for elevated PSA.   Patient was seen at [facility] in 2018 for urinary frequency. He was seen in the emergency room in March 2018 for urinary hesitancy, weak stream as well as nocturia. There, he was noted to have slight pyuria. He was treated with a course of antibiotics as well as started on 1 mg of terazosin.    Last PSA, total and free  Component     Latest Ref Rng & Units 01/14/2017  Prostate Specific Ag, Serum     0.0 - 4.0 ng/mL 2.9  PSA, Free     N/A ng/mL 0.55  PSA, Free Pct     % 19.0    PMH: Past Medical History:  Diagnosis Date   Degenerative arthritis    Elevated PSA    Headache    Lower back pain    per patient   Migraine    Osteoarthritis    Thyroid disease     Surgical History: Past Surgical History:  Procedure Laterality Date   COLONOSCOPY     COLONOSCOPY WITH PROPOFOL N/A 09/06/2019   Procedure: COLONOSCOPY WITH PROPOFOL;  Surgeon: Jonathon Bellows, MD;  Location: [facility] ENDOSCOPY;  Service: Gastroenterology;  Laterality: N/A;    Home Medications:  Allergies as of 06/27/2021   No Known Allergies      Medication List        Accurate as of June 26, 2021  5:56 PM. If you have any questions, ask your nurse or doctor.          levothyroxine 137 MCG tablet Commonly known as: SYNTHROID Take 137 mcg by mouth daily.   terazosin 10 MG capsule Commonly known as: HYTRIN Take by mouth.        Allergies: No Known Allergies  Family History: Family History  Problem Relation Age of Onset   Diabetes type I Mother    Diabetes Father    Leukemia Father     Social History:  reports that he has been smoking cigarettes. He has a 17.50 pack-year smoking history. He has never used smokeless tobacco. He  reports current alcohol use. He reports previous drug use.   Physical Exam: There were no vitals taken for this visit.  Constitutional:  Alert and oriented, No acute distress. HEENT: [place] AT, moist mucus membranes.  Trachea midline, no masses. Cardiovascular: No clubbing, cyanosis, or edema. Respiratory: Normal respiratory effort, no increased work of breathing. GI: Abdomen is soft, nontender, nondistended, no abdominal masses GU: No CVA tenderness Lymph: No cervical or inguinal lymphadenopathy. Skin: No rashes, bruises or suspicious lesions. Neurologic: Grossly intact, no focal deficits, moving all 4 extremities. Psychiatric: Normal mood and affect.  Laboratory Data:  Lab Results  Component Value Date   CREATININE 0.97 11/06/2020      Lab Results  Component Value Date   HGBA1C 5.6 01/14/2017    Urinalysis   Pertinent Imaging:   Assessment & Plan:     No follow-ups on file.  I,Kailey Littlejohn,acting as a Education administrator for Hollice Espy, MD.,have documented all relevant documentation on the behalf of Hollice Espy, MD,as directed by  Hollice Espy, MD while in the presence of Hollice Espy, [place] [address], [place], [place] [address] [phone]

## 2021-06-27 ENCOUNTER — Ambulatory Visit: Payer: Self-pay | Admitting: Urology

## 2021-07-11 NOTE — Progress Notes (Incomplete)
   07/11/21 4:10 PM   Brett Santos 1961/08/18 [id number]  Referring provider:  Crissie Figures, PA-C [place],  [place] [address] No chief complaint on file.    HPI: Brett Santos is a 60 y.o.male who presents today for further evaluation of elevated PSA/ds.  His last visit to urology was in 07/2017 at [facility]. PSA screening showed a value of 1.97.   Most recent PSA on 03/01/2021 was 5.9.       PMH: Past Medical History:  Diagnosis Date   Degenerative arthritis    Elevated PSA    Headache    Lower back pain    per patient   Migraine    Osteoarthritis    Thyroid disease     Surgical History: Past Surgical History:  Procedure Laterality Date   COLONOSCOPY     COLONOSCOPY WITH PROPOFOL N/A 09/06/2019   Procedure: COLONOSCOPY WITH PROPOFOL;  Surgeon: Jonathon Bellows, MD;  Location: [facility] ENDOSCOPY;  Service: Gastroenterology;  Laterality: N/A;    Home Medications:  Allergies as of 07/12/2021   No Known Allergies      Medication List        Accurate as of July 11, 2021  4:10 PM. If you have any questions, ask your nurse or doctor.          levothyroxine 137 MCG tablet Commonly known as: SYNTHROID Take 137 mcg by mouth daily.   terazosin 10 MG capsule Commonly known as: HYTRIN Take by mouth.        Allergies: No Known Allergies  Family History: Family History  Problem Relation Age of Onset   Diabetes type I Mother    Diabetes Father    Leukemia Father     Social History:  reports that he has been smoking cigarettes. He has a 17.50 pack-year smoking history. He has never used smokeless tobacco. He reports current alcohol use. He reports that he does not currently use drugs.   Physical Exam: There were no vitals taken for this visit.  Constitutional:  Alert and oriented, No acute distress. HEENT: [place] AT, moist mucus membranes.  Trachea midline, no masses. Cardiovascular: No clubbing, cyanosis, or edema. Respiratory:  Normal respiratory effort, no increased work of breathing. GI: Abdomen is soft, nontender, nondistended, no abdominal masses GU: No CVA tenderness Lymph: No cervical or inguinal lymphadenopathy. Skin: No rashes, bruises or suspicious lesions. Neurologic: Grossly intact, no focal deficits, moving all 4 extremities. Psychiatric: Normal mood and affect.  Laboratory Data:  Lab Results  Component Value Date   CREATININE 0.97 11/06/2020     Lab Results  Component Value Date   HGBA1C 5.6 01/14/2017    Urinalysis   Pertinent Imaging:    Assessment & Plan:     No follow-ups on file.  I,Kailey Littlejohn,acting as a Education administrator for Hollice Espy, MD.,have documented all relevant documentation on the behalf of Hollice Espy, MD,as directed by  Hollice Espy, MD while in the presence of Hollice Espy, [place] [address] [place], [place] [address] [phone]

## 2021-07-12 ENCOUNTER — Ambulatory Visit: Payer: Self-pay | Admitting: Urology

## 2021-08-14 NOTE — Progress Notes (Signed)
08/15/21 4:37 PM   Brett Santos 06/22/1961 [id number]  Referring provider:  Crissie Figures, PA-C [address] [place],  [name] [address] Chief Complaint  Patient presents with   Elevated PSA     HPI: Brett Santos is a 60 y.o.male who presents today for further evaluation of elevated PSA.   He was referred due to PSA rising from 4.5 to 5.9.  He has a personal history of BPH on 1 mg of terazosin which was followed by Dr Brett Santos.  He is now up to 10 mg.    He has had innumerable no shows to clinic. At the time he did not have transportation. He has since figured this issue out and will be driven by [employer].  Urine is negative today.   He is doing well today with no new urinary symptoms. He reports that his urinary symptoms that the does have improves when he takes his medicine.   He reports today that he has a family history of prostate cancer, breast cancer, and pancreatic cancer.   These are in his mother, aunts and uncles all siblings.     IPSS     Row Name 08/15/21 1600         International Prostate Symptom Score   How often have you had the sensation of not emptying your bladder? Almost always     How often have you had to urinate less than every two hours? Almost always     How often have you found you stopped and started again several times when you urinated? Less than half the time     How often have you found it difficult to postpone urination? Not at All     How often have you had a weak urinary stream? Less than half the time     How often have you had to strain to start urination? More than half the time     How many times did you typically get up at night to urinate? 4 Times     Total IPSS Score 22           Quality of Life due to urinary symptoms   If you were to spend the rest of your life with your urinary condition just the way it is now how would you feel about that? Mixed              Score:  1-7 Mild 8-19 Moderate 20-35 Severe  PSA  trend: 08/14/2017    1.97  01/17/2021     5.9  PMH: Past Medical History:  Diagnosis Date   Degenerative arthritis    Elevated PSA    Headache    Lower back pain    per patient   Migraine    Osteoarthritis    Thyroid disease     Surgical History: Past Surgical History:  Procedure Laterality Date   COLONOSCOPY     COLONOSCOPY WITH PROPOFOL N/A 09/06/2019   Procedure: COLONOSCOPY WITH PROPOFOL;  Surgeon: Brett Bellows, MD;  Location: [facility] ENDOSCOPY;  Service: Gastroenterology;  Laterality: N/A;    Home Medications:  Allergies as of 08/15/2021   No Known Allergies      Medication List        Accurate as of August 15, 2021  4:37 PM. If you have any questions, ask your nurse or doctor.          levothyroxine 137 MCG tablet Commonly known as: SYNTHROID Take 137 mcg by mouth daily.   terazosin  10 MG capsule Commonly known as: HYTRIN Take by mouth.        Allergies: No Known Allergies  Family History: Family History  Problem Relation Age of Onset   Diabetes type I Mother    Diabetes Father    Leukemia Father    Prostatitis Maternal Uncle    Prostate cancer Maternal Uncle     Social History:  reports that he has been smoking cigarettes. He has a 17.50 pack-year smoking history. He has never used smokeless tobacco. He reports current alcohol use. He reports that he does not currently use drugs.   Physical Exam: BP 90/68   Pulse 98   Ht 6' (1.829 m)   Wt 185 lb (83.9 kg)   BMI 25.09 kg/m   Constitutional:  Alert and oriented, No acute distress. HEENT: [place] AT, moist mucus membranes.  Trachea midline, no masses. Cardiovascular: No clubbing, cyanosis, or edema. Respiratory: Normal respiratory effort, no increased work of breathing. Rectal: Normal sphincter tone,  50  CC prostate, rubbery nodules approximately 76m near midline in the apex Skin: No rashes, bruises or suspicious lesions. Neurologic: Grossly intact, no focal deficits, moving all 4  extremities. Psychiatric: Normal mood and affect.  Laboratory Data:  Lab Results  Component Value Date   CREATININE 0.97 11/06/2020   Lab Results  Component Value Date   HGBA1C 5.6 01/14/2017    Urinalysis Negative    Assessment & Plan:   Elevated PSA  - PSA; pending   -  PSA is trended upwards, regardless what repeat lab indicate I strongly recommend a prostate biopsy to rule out any malignancy in setting of prostate nodule, rising PSA (over trend very concerning), and family history suggestive of possible BRCA pattern.  - We discussed prostate biopsy in detail including the procedure itself, the risks of blood in the urine, stool, and ejaculate, serious infection, and discomfort. He is willing to proceed with this as discussed.  2.Abnormal rectal exam  - Exam today was abnormal with a rubbery nodule near midline in the apex.   3. BPH  - Continue 10 mg terazosin    Scheduled prostate biopsy next week,patient arranged transportation to visit next week while still in clinic to help address barrier.  Stressed importance of follow up.  I,Brett Santos,acting as a sEducation administratorfor AHollice Espy MD.,have documented all relevant documentation on the behalf of AHollice Espy MD,as directed by  AHollice Espy MD while in the presence of AHollice Espy MD.  I have reviewed the above documentation for accuracy and completeness, and I agree with the above.   AHollice Espy MD   [facility] [address] [place] [phone]

## 2021-08-15 ENCOUNTER — Ambulatory Visit (INDEPENDENT_AMBULATORY_CARE_PROVIDER_SITE_OTHER): Payer: Medicaid Other | Admitting: Urology

## 2021-08-15 ENCOUNTER — Encounter: Payer: Self-pay | Admitting: Urology

## 2021-08-15 ENCOUNTER — Other Ambulatory Visit: Payer: Self-pay

## 2021-08-15 VITALS — BP 90/68 | HR 98 | Ht 72.0 in | Wt 185.0 lb

## 2021-08-15 DIAGNOSIS — R972 Elevated prostate specific antigen [PSA]: Secondary | ICD-10-CM

## 2021-08-15 NOTE — Patient Instructions (Signed)

## 2021-08-16 ENCOUNTER — Telehealth: Payer: Self-pay | Admitting: *Deleted

## 2021-08-16 LAB — URINALYSIS, COMPLETE
Bilirubin, UA: NEGATIVE
Glucose, UA: NEGATIVE
Ketones, UA: NEGATIVE
Leukocytes,UA: NEGATIVE
Nitrite, UA: NEGATIVE
Protein,UA: NEGATIVE
RBC, UA: NEGATIVE
Specific Gravity, UA: 1.02 (ref 1.005–1.030)
Urobilinogen, Ur: 0.2 mg/dL (ref 0.2–1.0)
pH, UA: 6.5 (ref 5.0–7.5)

## 2021-08-16 LAB — PSA: Prostate Specific Ag, Serum: 4.5 ng/mL — ABNORMAL HIGH (ref 0.0–4.0)

## 2021-08-16 LAB — MICROSCOPIC EXAMINATION
Bacteria, UA: NONE SEEN
Epithelial Cells (non renal): NONE SEEN /hpf (ref 0–10)

## 2021-08-16 NOTE — Telephone Encounter (Addendum)
Informed patient, patient is very anxious about procedure. Explained and reviewed procedure in detail, patient voiced understanding. States he knows he needs to have procedure done he is very nervous. Has a questions regarding his diet on what causes enlarged prostate. We could explain and review at time of visit.   ----- Message from Hollice Espy, MD sent at 08/16/2021 12:18 PM EDT ----- PSA remains elevated, agree with biopsy as discussed  Hollice Espy, MD

## 2021-08-20 NOTE — Progress Notes (Incomplete)
   08/20/21  CC: No chief complaint on file.     HPI:  Brett Santos is a 60 y.o. male with a personal history of elevated PSA and PH who presents today for prostate biopsy.   He was referred due to PSA rising from 4.5 to 5.9. Most recent PSA on 08/15/2021 was 4.5.   He has a family history of prostate cancer, breast cancer, and pancreatic cancer. These are in his mother, aunts, and uncles, all siblings.      There were no vitals filed for this visit. NED. A&Ox3.   No respiratory distress   Abd soft, NT, ND Normal external genitalia with patent urethral meatus  Prostate Biopsy Procedure   Informed consent was obtained after discussing risks/benefits of the procedure.  A time out was performed to ensure correct patient identity.  Pre-Procedure: - Last PSA Level: Component Prostate Specific Ag, Serum  Latest Ref Rng & Units 0.0 - 4.0 ng/mL  01/14/2017 2.9  08/15/2021 4.5 (H)   - Gentamicin given prophylactically - Levaquin 500 mg administered PO -Transrectal Ultrasound performed revealing a *** gm prostate -No significant hypoechoic or median lobe noted  Procedure: - Prostate block performed using 10 cc 1% lidocaine and biopsies taken from sextant areas, a total of 12 under ultrasound guidance.  Post-Procedure: - Patient tolerated the procedure well - He was counseled to seek immediate medical attention if experiences any severe pain, significant bleeding, or fevers - Return in one week to discuss biopsy results

## 2021-08-20 NOTE — Telephone Encounter (Signed)
Patient informed, scheduled Telephone/Virtual visit. Patient voiced understanding.

## 2021-08-20 NOTE — Telephone Encounter (Signed)
Please try to convince him to do a virtual visit at minimum.  I do not recommend skipping biopsy based on PSA rise and abnormal rectal exam.  Concern for cancer is high and not related to diet.  His rationale is misguided.  Hollice Espy, MD

## 2021-08-20 NOTE — Telephone Encounter (Signed)
Patient called to cancel prostate biopsy scheduled for tomorrow. Patient states he will not proceed with biopsy. He was unaware of factors of his diet that could affect his prostate. He states he has been doing some research online and feels more comfortable changing his diet and waiting two months for repeat labs vs prostate biopsy. Offered to keep appointment to review further with Dr. Ludwig Clarks was adamant about cancelling.

## 2021-08-21 ENCOUNTER — Other Ambulatory Visit: Payer: Medicaid Other | Admitting: Urology

## 2021-08-22 ENCOUNTER — Other Ambulatory Visit: Payer: Self-pay

## 2021-08-22 ENCOUNTER — Ambulatory Visit (INDEPENDENT_AMBULATORY_CARE_PROVIDER_SITE_OTHER): Payer: Medicaid Other | Admitting: Urology

## 2021-08-22 DIAGNOSIS — R972 Elevated prostate specific antigen [PSA]: Secondary | ICD-10-CM

## 2021-08-22 NOTE — Progress Notes (Signed)
  Brett Santos is a 60 y.o. male with a personal history of elevated PSA and BPH.   He was referred to urology for rising PSA from 4.5 to 5.9. PSA was rechecked on 08/15/2021 and it was 4.5.   He is currently on 10 mg terazosin for his BPH which is followed by Dr. Bernardo Heater.   He has a family history of prostate cancer, breast cancer, and pancreatic cancer.   These are in his mother, aunts and uncles all siblings.   He was scheduled for a prostate biopsy but he states he had been doing research to which he feels that he no longer needs a prostate biopsy. He was scheduled for a telephone encounter today to which he became frustrated with one of the nurses and hung-up. He has not answered any calls, I left a voicemail about the importance of this telephone call. I reached out to his primary care physician who is no longer at this facility. I left a message with DrVista Deck about my concerns in hopes he will be able to counsel patient on the importance of following-up. Will send patient a letter as well.   I,Kailey Littlejohn,acting as a Education administrator for Hollice Espy, MD.,have documented all relevant documentation on the behalf of Hollice Espy, MD,as directed by  Hollice Espy, MD while in the presence of Hollice Espy, MD.  I have reviewed the above documentation for accuracy and completeness, and I agree with the above.   Hollice Espy, MD

## 2021-08-28 ENCOUNTER — Ambulatory Visit: Payer: Medicaid Other | Admitting: Urology

## 2021-08-31 ENCOUNTER — Encounter: Payer: Self-pay | Admitting: *Deleted

## 2021-09-07 ENCOUNTER — Telehealth: Payer: Self-pay | Admitting: *Deleted

## 2021-09-07 NOTE — Telephone Encounter (Signed)
Pt calling SCREAMING wanting a 2nd opinion at the [facility] office because he had a bad experience at the [place] office. I advised pt that we are both the same office. He is yelling in the phone telling me that foods make your prostate swell and why wouldn't we let him try and change his diet to change his prostate numbers. Pt states Dr. Erlene Quan just want to "experiment" on him with a prostate biopsy. Pt was yelling so much I just listened and then he hung up when I didn't schedule him what he wanted.   ---- he never gave me time to respond, he was very disrespectful.

## 2021-09-28 ENCOUNTER — Ambulatory Visit: Payer: Medicaid Other | Admitting: Urology

## 2021-10-23 ENCOUNTER — Other Ambulatory Visit: Payer: Medicaid Other | Admitting: Urology

## 2021-10-26 ENCOUNTER — Ambulatory Visit: Payer: Medicaid Other | Admitting: Urology

## 2021-11-02 ENCOUNTER — Ambulatory Visit: Payer: Medicaid Other | Admitting: Urology

## 2021-11-23 ENCOUNTER — Ambulatory Visit: Payer: Medicaid Other | Admitting: Urology

## 2021-11-27 NOTE — Progress Notes (Signed)
11/27/21 11:12 AM   Brett Santos 12/24/1960 [id number]  Referring provider:  Crissie Figures, PA-C [place],  [place] [address] No chief complaint on file.    HPI: Brett Santos is a 61 y.o.male with a personal history of elevated PSA and BPH who returns today to discuss management of his elevated/ rising PSA.    He was referred to urology for rising PSA from 4.5 to 5.9.  He has a family history of prostate cancer, breast cancer, and pancreatic cancer.   These are in his mother, aunts and uncles all siblings.   He was previously scheduled for prostate biopsy but he did not want to go through with it due to research and him feeling he does not need a prostate biopsy.   He was scheduled for a telephone visit on 08/22/2021 to which he became frustrated with one of the nurses and hung-up. He has not answered any calls, I left a voicemail about the importance of this telephone call. I reached out to his primary care physician who is no longer at this facility. I left a message with DrVista Deck about my concerns in hopes he will be able to counsel patient on the importance of following-up.  His most recent PSA on 08/15/2021 was 4.5.  He reports today that he has researched on elevated PSA and enlarged prostate. He would like to go in depth on enlarged prostate and behavioral management of enlarged prostate.   He reports that his mother recently passed of cancer.   He became very aggressive towards me today after I tried to set appropriate doctor-patient relationship norms which include mutual respect for each with common goals of his improving/ maintaining his health, respect to my staff, open dialog, and etc given his behavior to myself and my staff in the past.  He begain raising his voice and approaching me in a aggressive manner. I remained calm and expressed a willingness to help him which seemed to further anger him.  Concerned by my physical safety at this point, I let  him know that has behavior as inappropriate and that I was scared.  He continued to yell and move towards me thus I left the room quickly and asked him to leave the office.  He continue to shout and scream profanity down the hall as he left.  He remained in the lobby area shouting until security eventually was able to escort him from the building.    PMH: Past Medical History:  Diagnosis Date   Degenerative arthritis    Elevated PSA    Headache    Lower back pain    per patient   Migraine    Osteoarthritis    Thyroid disease     Surgical History: Past Surgical History:  Procedure Laterality Date   COLONOSCOPY     COLONOSCOPY WITH PROPOFOL N/A 09/06/2019   Procedure: COLONOSCOPY WITH PROPOFOL;  Surgeon: Jonathon Bellows, MD;  Location: [facility] ENDOSCOPY;  Service: Gastroenterology;  Laterality: N/A;    Home Medications:  Allergies as of 11/28/2021   No Known Allergies      Medication List        Accurate as of November 27, 2021 11:12 AM. If you have any questions, ask your nurse or doctor.          levothyroxine 137 MCG tablet Commonly known as: SYNTHROID Take 137 mcg by mouth daily.   terazosin 10 MG capsule Commonly known as: HYTRIN Take by mouth.  Allergies: No Known Allergies  Family History: Family History  Problem Relation Age of Onset   Diabetes type I Mother    Diabetes Father    Leukemia Father    Prostatitis Maternal Uncle    Prostate cancer Maternal Uncle     Social History:  reports that he has been smoking cigarettes. He has a 17.50 pack-year smoking history. He has never used smokeless tobacco. He reports current alcohol use. He reports that he does not currently use drugs.   Assessment & Plan:    Elevated PSA  We had discussed today to rechecking a PSA and reassess the need for prostate biopsy versus MRI unfortunately due to his threatening and profane outburst, the appointment abruptly ended.  Unfortunately at this point time, we will  plan to dismiss him from our practice due to the concern for the safety of the providers and staff.  This is no longer productive or safe doctor-patient relationship.  Patient will be notified of his termination from the practice as per the appropriate [facility] protocol.  I will forward this note to his primary care physician to recommend referral to another urology practice to continue this ongoing discussion. - PSA; pending    I,Kailey Littlejohn,acting as a scribe for Hollice Espy, MD.,have documented all relevant documentation on the behalf of Hollice Espy, MD,as directed by  Hollice Espy, MD while in the presence of Hollice Espy, MD.  I have reviewed the above documentation for accuracy and completeness, and I agree with the above.   Hollice Espy, MD   [facility] [address] [place], [place] [address] [phone]  I spent 15 total minutes on the day of the encounter including pre-visit review of the medical record, face-to-face time with the patient, and post visit ordering of labs/imaging/tests.

## 2021-11-28 ENCOUNTER — Ambulatory Visit (INDEPENDENT_AMBULATORY_CARE_PROVIDER_SITE_OTHER): Payer: Medicaid Other | Admitting: Urology

## 2021-11-28 ENCOUNTER — Encounter: Payer: Self-pay | Admitting: Urology

## 2021-11-28 ENCOUNTER — Other Ambulatory Visit: Payer: Self-pay

## 2021-11-28 VITALS — BP 113/77 | HR 80 | Ht 72.0 in | Wt 185.0 lb

## 2021-11-28 DIAGNOSIS — R972 Elevated prostate specific antigen [PSA]: Secondary | ICD-10-CM | POA: Diagnosis not present

## 2021-12-12 ENCOUNTER — Other Ambulatory Visit: Payer: Self-pay

## 2021-12-12 ENCOUNTER — Ambulatory Visit
Admission: EM | Admit: 2021-12-12 | Discharge: 2021-12-12 | Disposition: A | Discharge status: Home / Self Care | Payer: Medicaid Other | Attending: Emergency Medicine | Admitting: Emergency Medicine

## 2021-12-12 DIAGNOSIS — K047 Periapical abscess without sinus: Secondary | ICD-10-CM | POA: Diagnosis not present

## 2021-12-12 MED ORDER — AMOXICILLIN-POT CLAVULANATE 875-125 MG PO TABS: 1.0000 | ORAL_TABLET | Freq: Two times a day (BID) | ORAL | 0 refills | Status: DC

## 2021-12-12 NOTE — ED Triage Notes (Signed)
Pt here with C/O swelling on left side of mouth Monday. Pt does have 2 broke teeth on lower left.

## 2021-12-12 NOTE — ED Provider Notes (Signed)
[facility]    CSN: [id number] Arrival date & time: 12/12/21  1333      History   Chief Complaint Chief Complaint  Patient presents with   Oral Swelling    HPI Brett Santos is a 61 y.o. male.   Patient presents with left lower facial swelling for 2 days.  Associated headache occurring 3 days ago which is now resolved.  Endorses that he has a broken teeth on the lower left gumline that is in need of dental work.  Has been having difficulty establishing care.  Denies fever, chills, difficulty swallowing, shortness of breath.  Has attempted to gargle peroxide which was not helpful.  Past Medical History:  Diagnosis Date   Degenerative arthritis    Elevated PSA    Headache    Lower back pain    per patient   Migraine    Osteoarthritis    Thyroid disease     Patient Active Problem List   Diagnosis Date Noted   Pre-diabetes 01/14/2017   Urinary hesitancy 01/14/2017   Hypothyroidism 01/04/2016    Past Surgical History:  Procedure Laterality Date   COLONOSCOPY     COLONOSCOPY WITH PROPOFOL N/A 09/06/2019   Procedure: COLONOSCOPY WITH PROPOFOL;  Surgeon: Jonathon Bellows, MD;  Location: [facility] ENDOSCOPY;  Service: Gastroenterology;  Laterality: N/A;       Home Medications    Prior to Admission medications   Medication Sig Start Date End Date Taking? Authorizing Provider  levothyroxine (SYNTHROID) 137 MCG tablet Take 137 mcg by mouth daily. 01/22/21  Yes [provider]  terazosin (HYTRIN) 10 MG capsule Take by mouth. 04/26/21  Yes [provider]  Aspirin-Acetaminophen-Caffeine [id number] MG PACK Take by mouth. 06/15/14   [provider]  citalopram (CELEXA) 20 MG tablet Take by mouth. 06/15/14   [provider]  levothyroxine (SYNTHROID) 125 MCG tablet levothyroxine 125 mcg tablet    [provider]  simvastatin (ZOCOR) 40 MG tablet Take by mouth. 06/15/14   [provider]  terazosin (HYTRIN) 1 MG capsule  terazosin 1 mg capsule  TAKE 1 CAPSULE BY MOUTH NIGHTLY 08/21/18   [provider]    Family History Family History  Problem Relation Age of Onset   Diabetes type I Mother    Diabetes Father    Leukemia Father    Prostatitis Maternal Uncle    Prostate cancer Maternal Uncle     Social History Social History   Tobacco Use   Smoking status: Every Day    Packs/day: 0.50    Years: 35.00    Pack years: 17.50    Types: Cigarettes   Smokeless tobacco: Never  Vaping Use   Vaping Use: Never used  Substance Use Topics   Alcohol use: Yes    Comment: rarely   Drug use: Not Currently    Comment: recovering crack addict-clean     Allergies   Patient has no known allergies.   Review of Systems Review of Systems  Constitutional: Negative.   HENT:  Positive for dental problem. Negative for congestion, drooling, ear discharge, ear pain, facial swelling, hearing loss, mouth sores, nosebleeds, postnasal drip, rhinorrhea, sinus pressure, sinus pain, sneezing, sore throat, tinnitus, trouble swallowing and voice change.   Respiratory: Negative.    Cardiovascular: Negative.   Skin: Negative.   Neurological: Negative.     Physical Exam Triage Vital Signs ED Triage Vitals  Enc Vitals Group     BP 12/12/21 1350 120/90     Pulse  Rate 12/12/21 1350 (!) 120     Resp 12/12/21 1350 18     Temp 12/12/21 1350 98.7 F (37.1 C)     Temp Source 12/12/21 1350 Oral     SpO2 12/12/21 1350 100 %     Weight 12/12/21 1349 190 lb (86.2 kg)     Height 12/12/21 1349 6' (1.829 m)     Head Circumference --      Peak Flow --      Pain Score 12/12/21 1349 0     Pain Loc --      Pain Edu? --      Excl. in [place]? --    No data found.  Updated Vital Signs BP 120/90 (BP Location: Left Arm)    Pulse (!) 120    Temp 98.7 F (37.1 C) (Oral)    Resp 18    Ht 6' (1.829 m)    Wt 190 lb (86.2 kg)    SpO2 100%    BMI 25.77 kg/m   Visual Acuity Right Eye Distance:   Left Eye Distance:   Bilateral  Distance:    Right Eye Near:   Left Eye Near:    Bilateral Near:     Physical Exam Constitutional:      Appearance: Normal appearance.  HENT:     Head: Normocephalic.     Mouth/Throat:     Pharynx: Oropharynx is clear. Uvula midline.     Comments: Dental decay with small abscess present along the left lower gumline, mild to moderate gingival swelling noted, no tenderness or drainage at the site Eyes:     Extraocular Movements: Extraocular movements intact.  Pulmonary:     Effort: Pulmonary effort is normal.  Skin:    General: Skin is warm and dry.  Neurological:     Mental Status: He is alert and oriented to person, place, and time. Mental status is at baseline.  Psychiatric:        Mood and Affect: Mood normal.        Behavior: Behavior normal.     UC Treatments / Results  Labs (all labs ordered are listed, but only abnormal results are displayed) Labs Reviewed - No data to display  EKG   Radiology No results found.  Procedures Procedures (including critical care time)  Medications Ordered in UC Medications - No data to display  Initial Impression / Assessment and Plan / UC Course  I have reviewed the triage vital signs and the nursing notes.  Pertinent labs & imaging results that were available during my care of the patient were reviewed by me and considered in my medical decision making (see chart for details).  Dental abscess  Augmentin 7-day course prescribed, recommend over-the-counter medications if pain begins to recur, given written handout of local dental resources for follow-up, urgent care follow-up as needed Final Clinical Impressions(s) / UC Diagnoses   Final diagnoses:  None   Discharge Instructions   None    ED Prescriptions   None    PDMP not reviewed this encounter.   Hans Eden, NP 12/12/21 1411

## 2021-12-12 NOTE — Discharge Instructions (Addendum)
Take Augmentin twice a day for 7 datys  Dental resources inside your packet that you may reach out to find a dentist   You may use 800 mg of ibuprofen every 8 hours and [phone] mg of tylenol every 6 hours if pain begins

## 2021-12-13 ENCOUNTER — Telehealth: Payer: Self-pay | Admitting: Emergency Medicine

## 2021-12-13 NOTE — Telephone Encounter (Signed)
Received a call from [place] stating that he had questions about his medication. Pt was seen on 12/12/21 by Lowella Petties, NP.   Called to speak with [place] from the number provided - [phone]. Phone was answered by a woman who states she used to be Reynol's Wife and she states that he is not available and this number is not correct to reach him. Pt has this number listed as both his phone and mobile. Could not reach patient.

## 2022-11-06 ENCOUNTER — Ambulatory Visit
Admission: EM | Admit: 2022-11-06 | Discharge: 2022-11-06 | Disposition: A | Discharge status: Home / Self Care | Payer: Medicaid Other | Attending: Nurse Practitioner | Admitting: Nurse Practitioner

## 2022-11-06 DIAGNOSIS — J40 Bronchitis, not specified as acute or chronic: Secondary | ICD-10-CM | POA: Diagnosis not present

## 2022-11-06 MED ORDER — AMOXICILLIN-POT CLAVULANATE 875-125 MG PO TABS: 1.0000 | ORAL_TABLET | Freq: Two times a day (BID) | ORAL | 0 refills | Status: AC

## 2022-11-06 NOTE — Discharge Instructions (Signed)
Augmentin twice daily for 10 days Rest and fluids Follow-up with your PCP 2 to 3 days for recheck Please go to the ER for any worsening symptoms I hope you feel better soon!

## 2022-11-06 NOTE — ED Provider Notes (Signed)
[facility]    CSN: [id number] Arrival date & time: 11/06/22  1445      History   Chief Complaint Chief Complaint  Patient presents with   URI    HPI Brett Santos is a 61 y.o. male The patient is a 61 y.o. male who presents for evaluation of URI symptoms for 3 weeks. Patient reports associated symptoms of productive cough. Denies N/V/D, fevers, body aches, sore throat, ear pain, shortness of breath. Patient does not have a hx of asthma.  Patient is an active smoker.  No known sick contacts and no recent travel. Pt is vaccinated for COVID. Pt is not vaccinated for flu this season. Pt has taken Mucinex OTC for symptoms. Pt has no other concerns at this time.    URI Presenting symptoms: cough     Past Medical History:  Diagnosis Date   Degenerative arthritis    Elevated PSA    Headache    Lower back pain    per patient   Migraine    Osteoarthritis    Thyroid disease     Patient Active Problem List   Diagnosis Date Noted   Pre-diabetes 01/14/2017   Urinary hesitancy 01/14/2017   Hypothyroidism 01/04/2016    Past Surgical History:  Procedure Laterality Date   COLONOSCOPY     COLONOSCOPY WITH PROPOFOL N/A 09/06/2019   Procedure: COLONOSCOPY WITH PROPOFOL;  Surgeon: Jonathon Bellows, MD;  Location: [facility] ENDOSCOPY;  Service: Gastroenterology;  Laterality: N/A;       Home Medications    Prior to Admission medications   Medication Sig Start Date End Date Taking? Authorizing Provider  amoxicillin-clavulanate (AUGMENTIN) 875-125 MG tablet Take 1 tablet by mouth every 12 (twelve) hours for 10 days. 11/06/22 11/16/22 Yes Melynda Ripple, NP  levothyroxine (SYNTHROID) 125 MCG tablet levothyroxine 125 mcg tablet   Yes [provider]  levothyroxine (SYNTHROID) 137 MCG tablet Take 137 mcg by mouth daily. 01/22/21  Yes [provider]  terazosin (HYTRIN) 1 MG capsule terazosin 1 mg capsule  TAKE 1 CAPSULE BY MOUTH NIGHTLY 08/21/18  Yes [provider]  terazosin (HYTRIN) 10 MG capsule Take by mouth. 04/26/21  Yes [provider]    Family History Family History  Problem Relation Age of Onset   Diabetes type I Mother    Diabetes Father    Leukemia Father    Prostatitis Maternal Uncle    Prostate cancer Maternal Uncle     Social History Social History   Tobacco Use   Smoking status: Every Day    Packs/day: 0.50    Years: 35.00    Total pack years: 17.50    Types: Cigarettes   Smokeless tobacco: Never  Vaping Use   Vaping Use: Never used  Substance Use Topics   Alcohol use: Yes    Comment: rarely   Drug use: Not Currently    Comment: recovering crack addict-clean     Allergies   Patient has no known allergies.   Review of Systems Review of Systems  Respiratory:  Positive for cough.      Physical Exam Triage Vital Signs ED Triage Vitals  Enc Vitals Group     BP 11/06/22 1602 126/85     Pulse Rate 11/06/22 1602 85     Resp 11/06/22 1602 16     Temp 11/06/22 1602 98.4 F (36.9 C)     Temp Source 11/06/22 1602 Oral     SpO2 11/06/22 1602 98 %  Weight 11/06/22 1559 170 lb (77.1 kg)     Height 11/06/22 1559 '5\' 9"'$  (1.753 m)     Head Circumference --      Peak Flow --      Pain Score 11/06/22 1559 0     Pain Loc --      Pain Edu? --      Excl. in [place]? --    No data found.  Updated Vital Signs BP 126/85 (BP Location: Left Arm)   Pulse 85   Temp 98.4 F (36.9 C) (Oral)   Resp 16   Ht '5\' 9"'$  (1.753 m)   Wt 170 lb (77.1 kg)   SpO2 98%   BMI 25.10 kg/m   Visual Acuity Right Eye Distance:   Left Eye Distance:   Bilateral Distance:    Right Eye Near:   Left Eye Near:    Bilateral Near:     Physical Exam Vitals and nursing note reviewed.  Constitutional:      General: He is not in acute distress.    Appearance: Normal appearance. He is not ill-appearing or toxic-appearing.  HENT:     Head: Normocephalic and atraumatic.     Right Ear: Tympanic membrane and ear canal  normal.     Left Ear: Tympanic membrane and ear canal normal.     Nose: Congestion present.     Mouth/Throat:     Mouth: Mucous membranes are moist.     Pharynx: No oropharyngeal exudate or posterior oropharyngeal erythema.  Eyes:     Pupils: Pupils are equal, round, and reactive to light.  Cardiovascular:     Rate and Rhythm: Normal rate and regular rhythm.     Heart sounds: Normal heart sounds.  Pulmonary:     Effort: Pulmonary effort is normal.     Breath sounds: Normal breath sounds.  Musculoskeletal:     Cervical back: Normal range of motion and neck supple.  Lymphadenopathy:     Cervical: No cervical adenopathy.  Skin:    General: Skin is warm and dry.  Neurological:     General: No focal deficit present.     Mental Status: He is alert and oriented to person, place, and time.  Psychiatric:        Mood and Affect: Mood normal.        Behavior: Behavior normal.      UC Treatments / Results  Labs (all labs ordered are listed, but only abnormal results are displayed) Labs Reviewed - No data to display  EKG   Radiology No results found.  Procedures Procedures (including critical care time)  Medications Ordered in UC Medications - No data to display  Initial Impression / Assessment and Plan / UC Course  I have reviewed the triage vital signs and the nursing notes.  Pertinent labs & imaging results that were available during my care of the patient were reviewed by me and considered in my medical decision making (see chart for details).     Reviewed exam and symptoms with patient. Start Augmentin given length of symptoms He will continue OTC cough medicine as needed Rest and fluids PCP follow-up 2 to 3 days for recheck ER for any worsening symptoms Final Clinical Impressions(s) / UC Diagnoses   Final diagnoses:  Bronchitis     Discharge Instructions      Augmentin twice daily for 10 days Rest and fluids Follow-up with your PCP 2 to 3 days for  recheck Please go to the ER for  any worsening symptoms I hope you feel better soon!   ED Prescriptions     Medication Sig Dispense Auth. Provider   amoxicillin-clavulanate (AUGMENTIN) 875-125 MG tablet Take 1 tablet by mouth every 12 (twelve) hours for 10 days. 20 tablet Melynda Ripple, NP      PDMP not reviewed this encounter.   Melynda Ripple, NP 11/06/22 [id number]

## 2022-11-06 NOTE — ED Triage Notes (Signed)
Pt c/o wet cough,congestion ongoing x3 wks. No otc tx, no known exposure. Denies any chills,fevers,or bodyaches.

## 2022-12-04 ENCOUNTER — Ambulatory Visit
Admission: EM | Admit: 2022-12-04 | Discharge: 2022-12-04 | Disposition: A | Discharge status: Home / Self Care | Payer: Medicaid Other | Attending: Emergency Medicine | Admitting: Emergency Medicine

## 2022-12-04 DIAGNOSIS — S29019A Strain of muscle and tendon of unspecified wall of thorax, initial encounter: Secondary | ICD-10-CM

## 2022-12-04 MED ORDER — BACLOFEN 10 MG PO TABS: 10.0000 mg | ORAL_TABLET | Freq: Three times a day (TID) | ORAL | 0 refills | Status: DC

## 2022-12-04 MED ORDER — IBUPROFEN 600 MG PO TABS: 600.0000 mg | ORAL_TABLET | Freq: Four times a day (QID) | ORAL | 0 refills | Status: DC | PRN

## 2022-12-04 NOTE — ED Provider Notes (Signed)
[facility]    CSN: [id number] Arrival date & time: 12/04/22  [place]      History   Chief Complaint Chief Complaint  Patient presents with   Spasms    HPI Brett Santos is a 62 y.o. male.   HPI  62 year old male here for evaluation of left mid back pain.  The patient reports that he woke up this morning and when he got out of bed he developed pain in his left mid back that has been pretty constant.  He has taken some Tylenol without any significant improvement of his pain.  He denies any heavy lifting, trauma, or other injuries.  The pain does increase with pressure and also movement.  Past Medical History:  Diagnosis Date   Degenerative arthritis    Elevated PSA    Headache    Lower back pain    per patient   Migraine    Osteoarthritis    Thyroid disease     Patient Active Problem List   Diagnosis Date Noted   Pre-diabetes 01/14/2017   Urinary hesitancy 01/14/2017   Hypothyroidism 01/04/2016    Past Surgical History:  Procedure Laterality Date   COLONOSCOPY     COLONOSCOPY WITH PROPOFOL N/A 09/06/2019   Procedure: COLONOSCOPY WITH PROPOFOL;  Surgeon: Jonathon Bellows, MD;  Location: [facility] ENDOSCOPY;  Service: Gastroenterology;  Laterality: N/A;       Home Medications    Prior to Admission medications   Medication Sig Start Date End Date Taking? Authorizing Provider  baclofen (LIORESAL) 10 MG tablet Take 1 tablet (10 mg total) by mouth 3 (three) times daily. 12/04/22  Yes Margarette Canada, NP  ibuprofen (ADVIL) 600 MG tablet Take 1 tablet (600 mg total) by mouth every 6 (six) hours as needed. 12/04/22  Yes Margarette Canada, NP  levothyroxine (SYNTHROID) 125 MCG tablet levothyroxine 125 mcg tablet   Yes [provider]  levothyroxine (SYNTHROID) 137 MCG tablet Take 137 mcg by mouth daily. 01/22/21  Yes [provider]  terazosin (HYTRIN) 1 MG capsule terazosin 1 mg capsule  TAKE 1 CAPSULE BY MOUTH NIGHTLY 08/21/18  Yes [provider]   terazosin (HYTRIN) 10 MG capsule Take by mouth. 04/26/21  Yes [provider]    Family History Family History  Problem Relation Age of Onset   Diabetes type I Mother    Diabetes Father    Leukemia Father    Prostatitis Maternal Uncle    Prostate cancer Maternal Uncle     Social History Social History   Tobacco Use   Smoking status: Every Day    Packs/day: 0.50    Years: 35.00    Total pack years: 17.50    Types: Cigarettes   Smokeless tobacco: Never  Vaping Use   Vaping Use: Never used  Substance Use Topics   Alcohol use: Yes    Comment: rarely   Drug use: Not Currently    Comment: recovering crack addict-clean     Allergies   Patient has no known allergies.   Review of Systems Review of Systems  Musculoskeletal:  Positive for back pain.     Physical Exam Triage Vital Signs ED Triage Vitals  Enc Vitals Group     BP 12/04/22 1850 (!) 159/112     Pulse Rate 12/04/22 1850 68     Resp 12/04/22 1850 16     Temp 12/04/22 1850 98.1 F (36.7 C)     Temp Source 12/04/22 1850 Oral     SpO2  12/04/22 1850 95 %     Weight 12/04/22 1849 175 lb (79.4 kg)     Height 12/04/22 1849 '5\' 9"'$  (1.753 m)     Head Circumference --      Peak Flow --      Pain Score --      Pain Loc --      Pain Edu? --      Excl. in [place]? --    No data found.  Updated Vital Signs BP (!) 159/112 (BP Location: Right Arm)   Pulse 68   Temp 98.1 F (36.7 C) (Oral)   Resp 16   Ht '5\' 9"'$  (1.753 m)   Wt 175 lb (79.4 kg)   SpO2 95%   BMI 25.84 kg/m   Visual Acuity Right Eye Distance:   Left Eye Distance:   Bilateral Distance:    Right Eye Near:   Left Eye Near:    Bilateral Near:     Physical Exam Vitals and nursing note reviewed.  Constitutional:      Appearance: Normal appearance.  Musculoskeletal:        General: Tenderness present. No signs of injury.  Skin:    General: Skin is warm and dry.     Capillary Refill: Capillary refill takes less than 2 seconds.   Neurological:     General: No focal deficit present.     Mental Status: He is alert and oriented to person, place, and time.  Psychiatric:        Mood and Affect: Mood normal.        Behavior: Behavior normal.        Thought Content: Thought content normal.        Judgment: Judgment normal.      UC Treatments / Results  Labs (all labs ordered are listed, but only abnormal results are displayed) Labs Reviewed - No data to display  EKG   Radiology No results found.  Procedures Procedures (including critical care time)  Medications Ordered in UC Medications - No data to display  Initial Impression / Assessment and Plan / UC Course  I have reviewed the triage vital signs and the nursing notes.  Pertinent labs & imaging results that were available during my care of the patient were reviewed by me and considered in my medical decision making (see chart for details).   Patient is a nontoxic-appearing 22-year-old male here for evaluation of left thoracic back pain that started this morning.  He has no midline spinous process tenderness but he does have tenderness and spasm in the lower left thoracic paraspinous region.  The right is normal.  The superior aspect of the left thoracic paraspinous region also does not demonstrate any spasm.  He is not experiencing any numbness or tingling in his fingers he has full range of motion of his upper extremities.  I will treat him for thoracic strain with ibuprofen and baclofen, moist heat, and home physical therapy.   Final Clinical Impressions(s) / UC Diagnoses   Final diagnoses:  Thoracic myofascial strain, initial encounter     Discharge Instructions      Take the ibuprofen, 600 mg every 6 hours with food, on a schedule for the next 48 hours and then as needed.  Take the baclofen 10 mg every 8 hours, on a schedule for the next 48 hours and then as needed.  Apply moist heat to your back for 30 minutes at a time 2-3 times a day to  improve blood  flow to the area and help remove the lactic acid causing the spasm.  Follow the back exercises given at discharge.  Return for reevaluation for any new or worsening symptoms.      ED Prescriptions     Medication Sig Dispense Auth. Provider   ibuprofen (ADVIL) 600 MG tablet Take 1 tablet (600 mg total) by mouth every 6 (six) hours as needed. 30 tablet Margarette Canada, NP   baclofen (LIORESAL) 10 MG tablet Take 1 tablet (10 mg total) by mouth 3 (three) times daily. 54 each Margarette Canada, NP      PDMP not reviewed this encounter.   Margarette Canada, NP 12/04/22 Einar Crow

## 2022-12-04 NOTE — Discharge Instructions (Signed)
Take the ibuprofen, 600 mg every 6 hours with food, on a schedule for the next 48 hours and then as needed.  Take the baclofen 10 mg every 8 hours, on a schedule for the next 48 hours and then as needed.  Apply moist heat to your back for 30 minutes at a time 2-3 times a day to improve blood flow to the area and help remove the lactic acid causing the spasm.  Follow the back exercises given at discharge.  Return for reevaluation for any new or worsening symptoms.

## 2022-12-04 NOTE — ED Triage Notes (Signed)
Pt c/o L upper back muscle spasms & pain onset this AM, denies any trauma or injury.

## 2022-12-10 ENCOUNTER — Other Ambulatory Visit: Payer: Self-pay | Admitting: Nurse Practitioner

## 2022-12-10 DIAGNOSIS — R042 Hemoptysis: Secondary | ICD-10-CM

## 2023-03-31 ENCOUNTER — Emergency Department
Admission: EM | Admit: 2023-03-31 | Discharge: 2023-03-31 | Disposition: A | Discharge status: Home / Self Care | Payer: Medicaid Other | Attending: Emergency Medicine | Admitting: Emergency Medicine

## 2023-03-31 ENCOUNTER — Other Ambulatory Visit: Payer: Self-pay

## 2023-03-31 ENCOUNTER — Emergency Department: Payer: Medicaid Other

## 2023-03-31 DIAGNOSIS — R1033 Periumbilical pain: Secondary | ICD-10-CM | POA: Diagnosis present

## 2023-03-31 DIAGNOSIS — R748 Abnormal levels of other serum enzymes: Secondary | ICD-10-CM | POA: Insufficient documentation

## 2023-03-31 DIAGNOSIS — R109 Unspecified abdominal pain: Secondary | ICD-10-CM

## 2023-03-31 LAB — CBC
HCT: 42.9 % (ref 39.0–52.0)
Hemoglobin: 13.7 g/dL (ref 13.0–17.0)
MCH: 30 pg (ref 26.0–34.0)
MCHC: 31.9 g/dL (ref 30.0–36.0)
MCV: 93.9 fL (ref 80.0–100.0)
Platelets: 210 10*3/uL (ref 150–400)
RBC: 4.57 MIL/uL (ref 4.22–5.81)
RDW: 14.6 % (ref 11.5–15.5)
WBC: 8.1 10*3/uL (ref 4.0–10.5)
nRBC: 0 % (ref 0.0–0.2)

## 2023-03-31 LAB — COMPREHENSIVE METABOLIC PANEL
ALT: 22 U/L (ref 0–44)
AST: 28 U/L (ref 15–41)
Albumin: 2.7 g/dL — ABNORMAL LOW (ref 3.5–5.0)
Alkaline Phosphatase: 63 U/L (ref 38–126)
Anion gap: 5 (ref 5–15)
BUN: 14 mg/dL (ref 8–23)
CO2: 25 mmol/L (ref 22–32)
Calcium: 8.3 mg/dL — ABNORMAL LOW (ref 8.9–10.3)
Chloride: 109 mmol/L (ref 98–111)
Creatinine, Ser: 1.31 mg/dL — ABNORMAL HIGH (ref 0.61–1.24)
GFR, Estimated: >60 mL/min (ref >60)
Glucose, Bld: 103 mg/dL — ABNORMAL HIGH (ref 70–99)
Potassium: 4 mmol/L (ref 3.5–5.1)
Sodium: 139 mmol/L (ref 135–145)
Total Bilirubin: 0.7 mg/dL (ref 0.3–1.2)
Total Protein: 6.1 g/dL — ABNORMAL LOW (ref 6.5–8.1)

## 2023-03-31 LAB — URINALYSIS, ROUTINE W REFLEX MICROSCOPIC
Bilirubin Urine: NEGATIVE
Glucose, UA: NEGATIVE mg/dL
Hgb urine dipstick: NEGATIVE
Ketones, ur: NEGATIVE mg/dL
Leukocytes,Ua: NEGATIVE
Nitrite: NEGATIVE
Protein, ur: NEGATIVE mg/dL
Specific Gravity, Urine: 1.02 (ref 1.005–1.030)
pH: 6 (ref 5.0–8.0)

## 2023-03-31 LAB — LIPASE, BLOOD: Lipase: 130 U/L — ABNORMAL HIGH (ref 11–51)

## 2023-03-31 MED ORDER — ONDANSETRON HCL 4 MG/2ML IJ SOLN
4.0000 mg | Freq: Once | INTRAMUSCULAR | Status: AC
  Administered 2023-03-31: 4 mg via INTRAVENOUS
  Filled 2023-03-31: qty 2

## 2023-03-31 MED ORDER — IOHEXOL 300 MG/ML  SOLN
100.0000 mL | Freq: Once | INTRAMUSCULAR | Status: AC | PRN
  Administered 2023-03-31: 100 mL via INTRAVENOUS

## 2023-03-31 MED ORDER — NAPROXEN 500 MG PO TABS: 500.0000 mg | ORAL_TABLET | Freq: Two times a day (BID) | ORAL | 2 refills | Status: DC

## 2023-03-31 MED ORDER — MORPHINE SULFATE (PF) 4 MG/ML IV SOLN
4.0000 mg | Freq: Once | INTRAVENOUS | Status: AC
  Administered 2023-03-31: 4 mg via INTRAVENOUS
  Filled 2023-03-31: qty 1

## 2023-03-31 NOTE — ED Notes (Signed)
Lab contacted to result collected labs.

## 2023-03-31 NOTE — ED Triage Notes (Signed)
LLQ abd pain described as a stabbing and twisting. Reports onset upon laying down ~0000. Reports ate dinner normally. Upon laying down he went to the bathroom and then the pain onset. Reports hurt to walk. Reports no bm today but reports normal bm yesterday. Denies hx of abd surgery. Pt alert and oriented following commands. Breathing unlabored speaking in full sentences. Symmetric chest rise and fall.

## 2023-03-31 NOTE — ED Notes (Signed)
See triage note  Presents with right sided abd pain  States pain started last pm  Became worse with ambulation  Denies any n/v/d or fever

## 2023-03-31 NOTE — ED Provider Notes (Signed)
[facility] Provider Note    Event Date/Time   First MD Initiated Contact with Patient 03/31/23 [phone]     (approximate)   History   Abdominal Pain   HPI  Brett Santos is a 62 y.o. male with no significant past medical history presents with complaints of pain around his umbilicus.  He reports it started suddenly last night.  He reports it is severe and continues.  He does report history of physical labor recently and thinks he may have overdone it.  No history of abdominal surgery.  No history of kidney stones.  No dysuria     Physical Exam   Triage Vital Signs: ED Triage Vitals  Enc Vitals Group     BP 03/31/23 0639 (!) 128/99     Pulse Rate 03/31/23 0639 73     Resp 03/31/23 0639 18     Temp 03/31/23 0639 98.2 F (36.8 C)     Temp Source 03/31/23 0639 Oral     SpO2 03/31/23 0639 98 %     Weight 03/31/23 0641 79.4 kg (175 lb)     Height 03/31/23 0641 1.803 m (5\' 11" )     Head Circumference --      Peak Flow --      Pain Score 03/31/23 0641 10     Pain Loc --      Pain Edu? --      Excl. in GC? --     Most recent vital signs: Vitals:   03/31/23 0639  BP: (!) 128/99  Pulse: 73  Resp: 18  Temp: 98.2 F (36.8 C)  SpO2: 98%     General: Awake, no distress.  CV:  Good peripheral perfusion.  Resp:  Normal effort.  Abd:  No distention.  Tenderness just lateral on the right to the umbilicus, question possible hernia, not easily reduced and tender Other:     ED Results / Procedures / Treatments   Labs (all labs ordered are listed, but only abnormal results are displayed) Labs Reviewed  LIPASE, BLOOD - Abnormal; Notable for the following components:      Result Value   Lipase 130 (*)    All other components within normal limits  COMPREHENSIVE METABOLIC PANEL - Abnormal; Notable for the following components:   Glucose, Bld 103 (*)    Creatinine, Ser 1.31 (*)    Calcium 8.3 (*)    Total Protein 6.1 (*)    Albumin 2.7 (*)    All  other components within normal limits  CBC  URINALYSIS, ROUTINE W REFLEX MICROSCOPIC     EKG     RADIOLOGY CT abdomen pelvis, reviewed interpreted by me, no ureterolithiasis, pending radiology eval    PROCEDURES:  Critical Care performed:   Procedures   MEDICATIONS ORDERED IN ED: Medications  morphine (PF) 4 MG/ML injection 4 mg (4 mg Intravenous Given 03/31/23 0812)  ondansetron (ZOFRAN) injection 4 mg (4 mg Intravenous Given 03/31/23 0812)  iohexol (OMNIPAQUE) 300 MG/ML solution 100 mL (100 mLs Intravenous Contrast Given 03/31/23 0832)     IMPRESSION / MDM / ASSESSMENT AND PLAN / ED COURSE  I reviewed the triage vital signs and the nursing notes. Patient's presentation is most consistent with acute presentation with potential threat to life or bodily function.  Patient presents with acute onset abdominal pain as detailed above.  Differential includes hernia, appendicitis, kidney stone, UTI  Will treat with IV morphine, IV Zofran, obtain CT abdomen pelvis, urinalysis  Lab work  notable for elevated lipase of 130 this could be a cause of his pain however point tenderness suspicious for hernia, will proceed with imaging.  Radiology notes prostatomegaly, discussed with patient and the need for outpatient PSA testing.  He is feeling improved after treatment.  No evidence of hernia on CT scan, question abdominal musculature injury, will start the patient on Naprosyn, urology referral, appropriate for discharge at this time      FINAL CLINICAL IMPRESSION(S) / ED DIAGNOSES   Final diagnoses:  Abdominal wall pain     Rx / DC Orders   ED Discharge Orders          Ordered    naproxen (NAPROSYN) 500 MG tablet  2 times daily with meals        03/31/23 0920    Ambulatory referral to Urology        03/31/23 0920             Note:  This document was prepared using Dragon voice recognition software and may include unintentional dictation errors.   Jene Every,  MD 03/31/23 [phone]

## 2023-07-05 ENCOUNTER — Ambulatory Visit
Admission: EM | Admit: 2023-07-05 | Discharge: 2023-07-05 | Disposition: A | Discharge status: Home / Self Care | Payer: Medicaid Other | Attending: Physician Assistant | Admitting: Physician Assistant

## 2023-07-05 DIAGNOSIS — R39198 Other difficulties with micturition: Secondary | ICD-10-CM | POA: Diagnosis present

## 2023-07-05 DIAGNOSIS — N400 Enlarged prostate without lower urinary tract symptoms: Secondary | ICD-10-CM | POA: Diagnosis not present

## 2023-07-05 LAB — URINALYSIS, W/ REFLEX TO CULTURE (INFECTION SUSPECTED)
Bilirubin Urine: NEGATIVE
Glucose, UA: NEGATIVE mg/dL
Ketones, ur: NEGATIVE mg/dL
Leukocytes,Ua: NEGATIVE
Nitrite: NEGATIVE
Protein, ur: 30 mg/dL — AB
Specific Gravity, Urine: 1.02 (ref 1.005–1.030)
Squamous Epithelial / HPF: NONE SEEN /HPF (ref 0–5)
WBC, UA: NONE SEEN WBC/hpf (ref 0–5)
pH: 6.5 (ref 5.0–8.0)

## 2023-07-05 MED ORDER — TERAZOSIN HCL 10 MG PO CAPS: 10.0000 mg | ORAL_CAPSULE | Freq: Every day | ORAL | 0 refills | Status: DC

## 2023-07-05 NOTE — ED Provider Notes (Signed)
[facility]    CSN: [id number] Arrival date & time: 07/05/23  1056      History   Chief Complaint Chief Complaint  Patient presents with   Medication Refill    HPI Brett Santos is a 62 y.o. male with history of enlarged prostate.  He presents today for difficulty urinating, urinary frequency and urgency that began yesterday and worsened today.  He reports he ran out of his terazosin medication 2 days ago and has had difficulty since.  He reports he has not been drinking much fluids since he is trying not to urinate.  He reports a lot of pressure whenever he tries to urinate and says it hurts.  He denies burning with urination, hematuria.  No report of penile or testicle pain or swelling.  Has an appoint with PCP on Tuesday.  Does not believe he can wait until Tuesday to be seen for these complaints.  States he needs a refill of the terazosin and he is confident he will be able to urinate like normal again.  He denies ever having to have a catheter placed and said he does not want to go to the ER.  No other complaints.  HPI  Past Medical History:  Diagnosis Date   Degenerative arthritis    Elevated PSA    Headache    Lower back pain    per patient   Migraine    Osteoarthritis    Thyroid disease     Patient Active Problem List   Diagnosis Date Noted   Pre-diabetes 01/14/2017   Urinary hesitancy 01/14/2017   Hypothyroidism 01/04/2016    Past Surgical History:  Procedure Laterality Date   COLONOSCOPY     COLONOSCOPY WITH PROPOFOL N/A 09/06/2019   Procedure: COLONOSCOPY WITH PROPOFOL;  Surgeon: Wyline Mood, MD;  Location[facility] ENDOSCOPY;  Service: Gastroenterology;  Laterality: N/A;       Home Medications    Prior to Admission medications   Medication Sig Start Date End Date Taking? Authorizing Provider  baclofen (LIORESAL) 10 MG tablet Take 1 tablet (10 mg total) by mouth 3 (three) times daily. 12/04/22  Yes Becky Augusta, NP  ibuprofen (ADVIL) 600 MG  tablet Take 1 tablet (600 mg total) by mouth every 6 (six) hours as needed. 12/04/22  Yes Becky Augusta, NP  levothyroxine (SYNTHROID) 125 MCG tablet levothyroxine 125 mcg tablet   Yes [provider]  levothyroxine (SYNTHROID) 137 MCG tablet Take 137 mcg by mouth daily. 01/22/21  Yes [provider]  naproxen (NAPROSYN) 500 MG tablet Take 1 tablet (500 mg total) by mouth 2 (two) times daily with a meal. 03/31/23  Yes Jene Every, MD  terazosin (HYTRIN) 10 MG capsule Take 1 capsule (10 mg total) by mouth daily. 07/05/23 08/04/23  Shirlee Latch, PA-C    Family History Family History  Problem Relation Age of Onset   Diabetes type I Mother    Diabetes Father    Leukemia Father    Prostatitis Maternal Uncle    Prostate cancer Maternal Uncle     Social History Social History   Tobacco Use   Smoking status: Every Day    Current packs/day: 0.50    Average packs/day: 0.5 packs/day for 35.0 years (17.5 ttl pk-yrs)    Types: Cigarettes   Smokeless tobacco: Never  Vaping Use   Vaping status: Never Used  Substance Use Topics   Alcohol use: Yes    Comment: rarely   Drug use: Not Currently  Comment: recovering crack addict-clean     Allergies   Patient has no known allergies.   Review of Systems Review of Systems  Constitutional:  Negative for fatigue and fever.  Gastrointestinal:  Negative for abdominal pain.  Genitourinary:  Positive for decreased urine volume, difficulty urinating, frequency and urgency. Negative for dysuria, flank pain, hematuria, penile discharge, penile pain, scrotal swelling and testicular pain.  Musculoskeletal:  Negative for back pain.  Neurological:  Negative for weakness.     Physical Exam Triage Vital Signs ED Triage Vitals  Encounter Vitals Group     BP 07/05/23 1202 113/79     Systolic BP Percentile --      Diastolic BP Percentile --      Pulse Rate 07/05/23 1202 82     Resp --      Temp 07/05/23 1202 98.2 F (36.8 C)      Temp Source 07/05/23 1202 Oral     SpO2 07/05/23 1202 99 %     Weight 07/05/23 1156 170 lb (77.1 kg)     Height 07/05/23 1156 5\' 10"  (1.778 m)     Head Circumference --      Peak Flow --      Pain Score 07/05/23 1158 10     Pain Loc --      Pain Education --      Exclude from Growth Chart --    No data found.  Updated Vital Signs BP 113/79 (BP Location: Left Arm)   Pulse 82   Temp 98.2 F (36.8 C) (Oral)   Ht 5\' 10"  (1.778 m)   Wt 170 lb (77.1 kg)   SpO2 99%   BMI 24.39 kg/m     Physical Exam Vitals and nursing note reviewed.  Constitutional:      General: He is not in acute distress.    Appearance: Normal appearance. He is well-developed. He is not ill-appearing.  HENT:     Head: Normocephalic and atraumatic.  Eyes:     General: No scleral icterus.    Conjunctiva/sclera: Conjunctivae normal.  Cardiovascular:     Rate and Rhythm: Normal rate and regular rhythm.  Pulmonary:     Effort: Pulmonary effort is normal. No respiratory distress.     Breath sounds: Normal breath sounds.  Abdominal:     Palpations: Abdomen is soft.     Tenderness: There is no abdominal tenderness. There is no right CVA tenderness or left CVA tenderness.  Musculoskeletal:     Cervical back: Neck supple.  Skin:    General: Skin is warm and dry.     Capillary Refill: Capillary refill takes less than 2 seconds.  Neurological:     General: No focal deficit present.     Mental Status: He is alert. Mental status is at baseline.     Motor: No weakness.     Gait: Gait normal.  Psychiatric:        Mood and Affect: Mood normal.        Behavior: Behavior normal.      UC Treatments / Results  Labs (all labs ordered are listed, but only abnormal results are displayed) Labs Reviewed  URINALYSIS, W/ REFLEX TO CULTURE (INFECTION SUSPECTED) - Abnormal; Notable for the following components:      Result Value   Hgb urine dipstick TRACE (*)    Protein, ur 30 (*)    Bacteria, UA RARE (*)    All  other components within normal limits    EKG  Radiology No results found.  Procedures Procedures (including critical care time)  Medications Ordered in UC Medications - No data to display  Initial Impression / Assessment and Plan / UC Course  I have reviewed the triage vital signs and the nursing notes.  Pertinent labs & imaging results that were available during my care of the patient were reviewed by me and considered in my medical decision making (see chart for details).   62 year old male with history of enlarged prostate presents for difficulty urinating, urinary frequency, hesitancy over the past couple of days.  Reports he ran out of his terazosin medication and he needs this to be able to urinate like normal.  Appoint with PCP on Tuesday but request to refill this medicine.  Vitals normal.  No abdominal tenderness on exam.  Urinalysis shows trace hemoglobin and protein.  No evidence of UTI.  Discussed these results with patient.  Sent terazosin to pharmacy.  Advised increasing fluids and rest.  Advised if he is still not able to urinate normally over the next 24 hours or symptoms worsen he will need to go to the ER for evaluation including potential catheter placement and bladder draining, bladder scan, etc.  Patient is agreeable.   Final Clinical Impressions(s) / UC Diagnoses   Final diagnoses:  Difficulty urinating  Enlarged prostate     Discharge Instructions      -Your urine does not show any evidence of infection. - I sent your medication to the pharmacy to help you urinate.  Hydrate well. - Keep your appointment with your PCP on Tuesday but if you feel that you still are not fully emptying your bladder you may need to go to the ER and have a catheter placed.     ED Prescriptions     Medication Sig Dispense Auth. Provider   terazosin (HYTRIN) 10 MG capsule Take 1 capsule (10 mg total) by mouth daily. 30 capsule Shirlee Latch, PA-C      PDMP not  reviewed this encounter.   Shirlee Latch, PA-C 07/05/23 1338

## 2023-07-05 NOTE — Discharge Instructions (Addendum)
-  Your urine does not show any evidence of infection. - I sent your medication to the pharmacy to help you urinate.  Hydrate well. - Keep your appointment with your PCP on Tuesday but if you feel that you still are not fully emptying your bladder you may need to go to the ER and have a catheter placed.

## 2023-07-05 NOTE — ED Triage Notes (Signed)
Pt c/o enlarged prostate and he can not urinate.   Pt has missed his last doctors appointment and can not get a refill on his medication.   Pt states that he can not urinate now and it is very painful to do so. Pt states that he has urinated 8 times in the last 3 hours to help relieve the pain.   Pt states that the pain is a 18/10.

## 2024-04-08 ENCOUNTER — Encounter: Payer: Self-pay | Admitting: Emergency Medicine

## 2024-04-08 ENCOUNTER — Emergency Department
Admission: EM | Admit: 2024-04-08 | Discharge: 2024-04-08 | Disposition: A | Discharge status: Home / Self Care | Attending: Emergency Medicine | Admitting: Emergency Medicine

## 2024-04-08 ENCOUNTER — Other Ambulatory Visit: Payer: Self-pay

## 2024-04-08 DIAGNOSIS — G43009 Migraine without aura, not intractable, without status migrainosus: Secondary | ICD-10-CM | POA: Diagnosis not present

## 2024-04-08 DIAGNOSIS — R110 Nausea: Secondary | ICD-10-CM | POA: Diagnosis present

## 2024-04-08 MED ORDER — IBUPROFEN 600 MG PO TABS: 600.0000 mg | ORAL_TABLET | Freq: Four times a day (QID) | ORAL | 0 refills | Status: DC | PRN

## 2024-04-08 MED ORDER — DIPHENHYDRAMINE HCL 50 MG/ML IJ SOLN
12.5000 mg | Freq: Once | INTRAMUSCULAR | Status: AC
  Administered 2024-04-08: 12.5 mg via INTRAVENOUS
  Filled 2024-04-08: qty 1

## 2024-04-08 MED ORDER — KETOROLAC TROMETHAMINE 30 MG/ML IJ SOLN
30.0000 mg | Freq: Once | INTRAMUSCULAR | Status: AC
  Administered 2024-04-08: 30 mg via INTRAVENOUS
  Filled 2024-04-08: qty 1

## 2024-04-08 MED ORDER — SUMATRIPTAN SUCCINATE 100 MG PO TABS: 100.0000 mg | ORAL_TABLET | Freq: Once | ORAL | 0 refills | Status: AC | PRN

## 2024-04-08 MED ORDER — DEXAMETHASONE SODIUM PHOSPHATE 10 MG/ML IJ SOLN
10.0000 mg | Freq: Once | INTRAMUSCULAR | Status: AC
  Administered 2024-04-08: 10 mg via INTRAVENOUS
  Filled 2024-04-08: qty 1

## 2024-04-08 MED ORDER — PROCHLORPERAZINE EDISYLATE 10 MG/2ML IJ SOLN
10.0000 mg | Freq: Once | INTRAMUSCULAR | Status: AC
  Administered 2024-04-08: 10 mg via INTRAVENOUS
  Filled 2024-04-08: qty 2

## 2024-04-08 NOTE — ED Notes (Signed)
 See triage note  Presents with intermittent headache for the past couple of weeks  Denies any fever  No trauma

## 2024-04-08 NOTE — ED Triage Notes (Signed)
 Pt via POV from home. Pt c/o migraine, reports he has a hx of migraine. Reports that he had the shingles shot 2 weeks ago, and had intermittent migraine since then. Reports migraine with nausea. Reports started this morning. Pt is A&Ox4 and NAD, ambulatory to triage with steady gait.

## 2024-04-08 NOTE — ED Provider Notes (Signed)
 [facility] Provider Note    Event Date/Time   First MD Initiated Contact with Patient 04/08/24 1208     (approximate)   History   Migraine   HPI  Brett Santos is a 63 y.o. male with history of migraine and as listed in EMR presents to the emergency department for treatment and evaluation of migraine headache that started this morning with associated nausea and photophobia.  Since having the shingles shot about 2 weeks ago migraine frequency has increased.Aaron Aas      Physical Exam   Triage Vital Signs: ED Triage Vitals  Encounter Vitals Group     BP 04/08/24 1119 (!) 121/101     Systolic BP Percentile --      Diastolic BP Percentile --      Pulse Rate 04/08/24 1119 97     Resp 04/08/24 1119 18     Temp 04/08/24 1119 97.8 F (36.6 C)     Temp Source 04/08/24 1119 Oral     SpO2 04/08/24 1119 98 %     Weight 04/08/24 1118 185 lb (83.9 kg)     Height 04/08/24 1118 5\' 11"  (1.803 m)     Head Circumference --      Peak Flow --      Pain Score 04/08/24 1118 5     Pain Loc --      Pain Education --      Exclude from Growth Chart --     Most recent vital signs: Vitals:   04/08/24 1119  BP: (!) 121/101  Pulse: 97  Resp: 18  Temp: 97.8 F (36.6 C)  SpO2: 98%    General: Awake, no distress.  CV:  Good peripheral perfusion.  Resp:  Normal effort.  Abd:  No distention.  Other:  No neurological deficits.   ED Results / Procedures / Treatments   Labs (all labs ordered are listed, but only abnormal results are displayed) Labs Reviewed - No data to display   EKG  Not indicated   RADIOLOGY  Image and radiology report reviewed and interpreted by me. Radiology report consistent with the same.  Not indicated  PROCEDURES:  Critical Care performed: No  Procedures   MEDICATIONS ORDERED IN ED:  Medications  dexamethasone  (DECADRON ) injection 10 mg (10 mg Intravenous Given 04/08/24 1244)  ketorolac  (TORADOL ) 30 MG/ML injection 30 mg  (30 mg Intravenous Given 04/08/24 1243)  prochlorperazine  (COMPAZINE ) injection 10 mg (10 mg Intravenous Given 04/08/24 1244)  diphenhydrAMINE  (BENADRYL ) injection 12.5 mg (12.5 mg Intravenous Given 04/08/24 1243)     IMPRESSION / MDM / ASSESSMENT AND PLAN / ED COURSE   I have reviewed the triage note.  Differential diagnosis includes, but is not limited to, migraine, tension headache, sinusitis, intracranial pathology  Patient's presentation is most consistent with acute illness / injury with system symptoms.  63 year old male presenting to the emergency department for treatment and evaluation of migraine on the left side of his forehead and temple area that has been intermittent for the past 2 weeks after getting his shingles shot.  This occurrence has been present for the past 2 days and has not been relieved with over-the-counter medications.  In the past, he has been under the care of of neurology and given multiple types medication but he is not currently on any preventive therapy.  On exam there are no acute neurological concerns.  Plan will be to give him a headache cocktail and reassess.  Upon reassessment, patient reports that  his symptoms have resolved.  He will be given a prescription for sumatriptan  and encouraged to continue ibuprofen .  He was advised to follow-up with his primary care provider if his migraines continue to be frequent and persistent.  If symptoms change or worsen and he is unable to schedule appointment, he was encouraged to return to the emergency department.      FINAL CLINICAL IMPRESSION(S) / ED DIAGNOSES   Final diagnoses:  Migraine without aura and without status migrainosus, not intractable     Rx / DC Orders   ED Discharge Orders          Ordered    SUMAtriptan  (IMITREX ) 100 MG tablet  Once PRN        04/08/24 1340    ibuprofen  (ADVIL ) 600 MG tablet  Every 6 hours PRN        04/08/24 1340             Note:  This document was prepared  using Dragon voice recognition software and may include unintentional dictation errors.   Sherryle Don, FNP 04/08/24 [place]    Iver Marker, MD 04/18/24 [phone]

## 2024-04-10 ENCOUNTER — Other Ambulatory Visit: Payer: Self-pay

## 2024-04-10 ENCOUNTER — Emergency Department

## 2024-04-10 ENCOUNTER — Emergency Department
Admission: EM | Admit: 2024-04-10 | Discharge: 2024-04-11 | Disposition: A | Discharge status: Home / Self Care | Attending: Emergency Medicine | Admitting: Emergency Medicine

## 2024-04-10 ENCOUNTER — Encounter: Payer: Self-pay | Admitting: Emergency Medicine

## 2024-04-10 DIAGNOSIS — M79641 Pain in right hand: Secondary | ICD-10-CM | POA: Insufficient documentation

## 2024-04-10 DIAGNOSIS — S0990XA Unspecified injury of head, initial encounter: Secondary | ICD-10-CM | POA: Insufficient documentation

## 2024-04-10 DIAGNOSIS — M79651 Pain in right thigh: Secondary | ICD-10-CM | POA: Insufficient documentation
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
## 2024-04-11 ENCOUNTER — Emergency Department

## 2024-04-11 MED ORDER — ONDANSETRON 4 MG PO TBDP
4.0000 mg | ORAL_TABLET | Freq: Once | ORAL | Status: AC
  Administered 2024-04-11: 4 mg via ORAL
  Filled 2024-04-11: qty 1

## 2024-04-11 MED ORDER — HYDROCODONE-ACETAMINOPHEN 5-325 MG PO TABS
2.0000 | ORAL_TABLET | Freq: Once | ORAL | Status: AC
  Administered 2024-04-11: 2 via ORAL
  Filled 2024-04-11: qty 2

## 2024-04-11 NOTE — Discharge Instructions (Addendum)
You may alternate Tylenol 1000 mg every 6 hours as needed for pain, fever and Ibuprofen 800 mg every 6-8 hours as needed for pain, fever.  Please take Ibuprofen with food.  Do not take more than 4000 mg of Tylenol (acetaminophen) in a 24 hour period. ° °

## 2024-04-29 ENCOUNTER — Emergency Department
Admission: EM | Admit: 2024-04-29 | Discharge: 2024-04-29 | Disposition: A | Discharge status: Home / Self Care | Attending: Emergency Medicine | Admitting: Emergency Medicine

## 2024-04-29 ENCOUNTER — Other Ambulatory Visit: Payer: Self-pay

## 2024-04-29 ENCOUNTER — Emergency Department

## 2024-04-29 ENCOUNTER — Encounter: Payer: Self-pay | Admitting: Intensive Care

## 2024-04-29 DIAGNOSIS — S8011XA Contusion of right lower leg, initial encounter: Secondary | ICD-10-CM | POA: Insufficient documentation

## 2024-04-29 DIAGNOSIS — M79604 Pain in right leg: Secondary | ICD-10-CM

## 2024-04-29 DIAGNOSIS — S8991XA Unspecified injury of right lower leg, initial encounter: Secondary | ICD-10-CM | POA: Diagnosis present
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
## 2024-05-18 ENCOUNTER — Ambulatory Visit
Admission: EM | Admit: 2024-05-18 | Discharge: 2024-05-18 | Disposition: A | Discharge status: Home / Self Care | Attending: Physician Assistant | Admitting: Physician Assistant

## 2024-05-18 ENCOUNTER — Encounter: Payer: Self-pay | Admitting: Emergency Medicine

## 2024-05-18 DIAGNOSIS — J209 Acute bronchitis, unspecified: Secondary | ICD-10-CM

## 2024-05-18 DIAGNOSIS — Z720 Tobacco use: Secondary | ICD-10-CM

## 2024-05-18 DIAGNOSIS — R051 Acute cough: Secondary | ICD-10-CM | POA: Diagnosis not present

## 2024-05-18 MED ORDER — PROMETHAZINE-DM 6.25-15 MG/5ML PO SYRP: 5.0000 mL | ORAL_SOLUTION | Freq: Four times a day (QID) | ORAL | 0 refills | Status: DC | PRN

## 2024-05-18 MED ORDER — DOXYCYCLINE HYCLATE 100 MG PO CAPS: 100.0000 mg | ORAL_CAPSULE | Freq: Two times a day (BID) | ORAL | 0 refills | Status: AC

## 2024-05-18 NOTE — ED Provider Notes (Signed)
 [facility]    CSN: [id number] Arrival date & time: 05/18/24  0900      History   Chief Complaint Chief Complaint  Patient presents with   Cough    HPI Brett Santos is a 63 y.o. male presenting for 2 to 3-week history of productive cough and chest congestion.  Patient denies fever, fatigue, nasal congestion, sore throat, chest pain, shortness of breath.  Patient is a chronic smoker and says he knows he needs to stop smoking but it is a difficult habit to break.  Denies any recent worsening of symptoms but says he is not really feeling much better.  No history of COPD or asthma.  HPI  Past Medical History:  Diagnosis Date   Degenerative arthritis    Elevated PSA    Headache    Lower back pain    per patient   Migraine    Osteoarthritis    Thyroid disease     Patient Active Problem List   Diagnosis Date Noted   Pre-diabetes 01/14/2017   Urinary hesitancy 01/14/2017   Hypothyroidism 01/04/2016    Past Surgical History:  Procedure Laterality Date   COLONOSCOPY     COLONOSCOPY WITH PROPOFOL  N/A 09/06/2019   Procedure: COLONOSCOPY WITH PROPOFOL ;  Surgeon: Therisa Bi, MD;  Location: [facility] ENDOSCOPY;  Service: Gastroenterology;  Laterality: N/A;       Home Medications    Prior to Admission medications   Medication Sig Start Date End Date Taking? Authorizing Provider  doxycycline (VIBRAMYCIN) 100 MG capsule Take 1 capsule (100 mg total) by mouth 2 (two) times daily for 7 days. 05/18/24 05/25/24 Yes Arvis Jolan NOVAK, PA-C  promethazine-dextromethorphan (PROMETHAZINE-DM) 6.25-15 MG/5ML syrup Take 5 mLs by mouth 4 (four) times daily as needed. 05/18/24  Yes Arvis Jolan B, PA-C  ibuprofen  (ADVIL ) 600 MG tablet Take 1 tablet (600 mg total) by mouth every 6 (six) hours as needed. 04/08/24   Triplett, Kirk B, FNP  levothyroxine  (SYNTHROID ) 125 MCG tablet levothyroxine  125 mcg tablet    [provider]  levothyroxine  (SYNTHROID ) 137 MCG tablet Take 137 mcg by  mouth daily. 01/22/21   [provider]  SUMAtriptan  (IMITREX ) 100 MG tablet Take 1 tablet (100 mg total) by mouth once as needed for migraine. May repeat in 2 hours if headache persists or recurs. 04/08/24 04/09/25  Triplett, Kirk B, FNP  terazosin  (HYTRIN ) 10 MG capsule Take 1 capsule (10 mg total) by mouth daily. 07/05/23 08/04/23  Arvis Jolan NOVAK, PA-C    Family History Family History  Problem Relation Age of Onset   Diabetes type I Mother    Diabetes Father    Leukemia Father    Prostatitis Maternal Uncle    Prostate cancer Maternal Uncle     Social History Social History   Tobacco Use   Smoking status: Every Day    Current packs/day: 0.50    Average packs/day: 0.5 packs/day for 35.0 years (17.5 ttl pk-yrs)    Types: Cigarettes   Smokeless tobacco: Never  Vaping Use   Vaping status: Never Used  Substance Use Topics   Alcohol use: Yes    Comment: rarely   Drug use: Yes    Types: Marijuana    Comment: recovering crack addict-clean     Allergies   Patient has no known allergies.   Review of Systems Review of Systems  Constitutional:  Negative for fatigue and fever.  HENT:  Positive for congestion. Negative for rhinorrhea, sinus pressure, sinus pain and  sore throat.   Respiratory:  Positive for cough. Negative for shortness of breath.   Cardiovascular:  Negative for chest pain.  Gastrointestinal:  Negative for abdominal pain, diarrhea, nausea and vomiting.  Musculoskeletal:  Negative for myalgias.  Neurological:  Negative for weakness, light-headedness and headaches.  Hematological:  Negative for adenopathy.     Physical Exam Triage Vital Signs ED Triage Vitals  Encounter Vitals Group     BP 05/18/24 0929 118/79     Girls Systolic BP Percentile --      Girls Diastolic BP Percentile --      Boys Systolic BP Percentile --      Boys Diastolic BP Percentile --      Pulse Rate 05/18/24 0929 78     Resp 05/18/24 0929 16     Temp 05/18/24 0929 98.1 F (36.7  C)     Temp Source 05/18/24 0929 Oral     SpO2 05/18/24 0929 96 %     Weight --      Height --      Head Circumference --      Peak Flow --      Pain Score 05/18/24 0928 0     Pain Loc --      Pain Education --      Exclude from Growth Chart --    No data found.  Updated Vital Signs BP 118/79 (BP Location: Right Arm)   Pulse 78   Temp 98.1 F (36.7 C) (Oral)   Resp 16   SpO2 96%     Physical Exam Vitals and nursing note reviewed.  Constitutional:      General: He is not in acute distress.    Appearance: Normal appearance. He is well-developed. He is not ill-appearing.  HENT:     Head: Normocephalic and atraumatic.     Nose: No congestion.     Mouth/Throat:     Mouth: Mucous membranes are moist.     Pharynx: Oropharynx is clear.   Eyes:     General: No scleral icterus.    Conjunctiva/sclera: Conjunctivae normal.    Cardiovascular:     Rate and Rhythm: Normal rate and regular rhythm.  Pulmonary:     Effort: Pulmonary effort is normal. No respiratory distress.     Breath sounds: Rhonchi present.   Musculoskeletal:     Cervical back: Neck supple.   Skin:    General: Skin is warm and dry.     Capillary Refill: Capillary refill takes less than 2 seconds.   Neurological:     General: No focal deficit present.     Mental Status: He is alert. Mental status is at baseline.     Motor: No weakness.     Gait: Gait normal.   Psychiatric:        Mood and Affect: Mood normal.        Behavior: Behavior normal.      UC Treatments / Results  Labs (all labs ordered are listed, but only abnormal results are displayed) Labs Reviewed - No data to display  EKG   Radiology No results found.  Procedures Procedures (including critical care time)  Medications Ordered in UC Medications - No data to display  Initial Impression / Assessment and Plan / UC Course  I have reviewed the triage vital signs and the nursing notes.  Pertinent labs & imaging results  that were available during my care of the patient were reviewed by me and considered in my medical  decision making (see chart for details).   63 year old male presents for cough and congestion for the past 2 to 3 weeks.  No fever, nasal congestion, sore throat, chest pain or shortness of breath.  Tobacco user.  Vitals are stable and normal and he is overall well-appearing.  No acute distress.  Normal HEENT exam.  Scattered rhonchi throughout upper lung fields.  Presentation consistent with acute bronchitis.  May have underlying chronic bronchitis.  Will treat at this time with doxycycline given length of symptoms.  Also sent Promethazine DM and discussed cutting back and eventually quitting smoking.  Reviewed returning for fever, cough not improving in couple weeks or shortness of breath.   Final Clinical Impressions(s) / UC Diagnoses   Final diagnoses:  Acute bronchitis, unspecified organism  Acute cough  Tobacco abuse     Discharge Instructions      - Presentation consistent with bronchitis or chest cold. - Continue to try to stop smoking. - Increase rest and fluids. - I sent cough medicine and antibiotic to the pharmacy for you. - Cough related bronchitis can last for few weeks.  If you develop a fever or shortness of breath please return for reevaluation.   ED Prescriptions     Medication Sig Dispense Auth. Provider   doxycycline (VIBRAMYCIN) 100 MG capsule Take 1 capsule (100 mg total) by mouth 2 (two) times daily for 7 days. 14 capsule Arvis Jolan NOVAK, PA-C   promethazine-dextromethorphan (PROMETHAZINE-DM) 6.25-15 MG/5ML syrup Take 5 mLs by mouth 4 (four) times daily as needed. 118 mL Arvis Jolan NOVAK, PA-C      PDMP not reviewed this encounter.   Arvis Jolan NOVAK, PA-C 05/18/24 [phone]

## 2024-05-18 NOTE — Discharge Instructions (Signed)
-   Presentation consistent with bronchitis or chest cold. - Continue to try to stop smoking. - Increase rest and fluids. - I sent cough medicine and antibiotic to the pharmacy for you. - Cough related bronchitis can last for few weeks.  If you develop a fever or shortness of breath please return for reevaluation.

## 2024-05-18 NOTE — ED Triage Notes (Signed)
 Pt presents with a productive cough and congestion x 2 weeks.

## 2024-05-28 ENCOUNTER — Encounter: Payer: Self-pay | Admitting: Emergency Medicine

## 2024-05-28 ENCOUNTER — Ambulatory Visit
Admission: EM | Admit: 2024-05-28 | Discharge: 2024-05-28 | Disposition: A | Discharge status: Home / Self Care | Attending: Family Medicine | Admitting: Family Medicine

## 2024-05-28 DIAGNOSIS — F17210 Nicotine dependence, cigarettes, uncomplicated: Secondary | ICD-10-CM | POA: Diagnosis not present

## 2024-05-28 DIAGNOSIS — N400 Enlarged prostate without lower urinary tract symptoms: Secondary | ICD-10-CM | POA: Diagnosis not present

## 2024-05-28 DIAGNOSIS — J40 Bronchitis, not specified as acute or chronic: Secondary | ICD-10-CM

## 2024-05-28 DIAGNOSIS — L739 Follicular disorder, unspecified: Secondary | ICD-10-CM | POA: Diagnosis not present

## 2024-05-28 MED ORDER — PREDNISONE 10 MG (21) PO TBPK: ORAL_TABLET | Freq: Every day | ORAL | 0 refills | Status: DC

## 2024-05-28 MED ORDER — TERAZOSIN HCL 10 MG PO CAPS: 10.0000 mg | ORAL_CAPSULE | Freq: Every day | ORAL | 0 refills | Status: DC

## 2024-05-28 MED ORDER — LEVOFLOXACIN 500 MG PO TABS: 500.0000 mg | ORAL_TABLET | Freq: Every day | ORAL | 0 refills | Status: AC

## 2024-05-28 NOTE — Discharge Instructions (Addendum)
 Stop by the pharmacy to pick up your prescriptions.  Follow up with your primary care provider or return to the urgent care, if not improving.   Remember to use a warm towel prior to shaving your head to prevent the hairs from being trapped. Clean your razors regularly.    Follow up with your urologist as scheduled.

## 2024-05-28 NOTE — ED Provider Notes (Signed)
 [facility]    CSN: [id number] Arrival date & time: 05/28/24  1533      History   Chief Complaint Chief Complaint  Patient presents with   Rash   Cough   Medication Refill    HPI Brett Santos is a 63 y.o. male.   HPI  History obtained from the patient. Brett Santos presents for scalp rash, cough and request for medication refill.  Notes a rash on the posterior part of his scalp after shaving with a somewhat electric razor.  No treatments prior to arrival.  He can feel the bumps with his hand.  The symptoms started 3 days ago.  He continues to have a persistent productive cough that can sometimes be dry.  The cough wakes him up at night.  He does smoke but he is trying to work on quitting.  He was seen here about 2 weeks ago for the same and diagnosed with bronchitis.  He was given doxycycline  and a cough syrup.   Has 3-4 pills of Terazosin  for enlarged prostate and frequent urination. Has an appointment with a urologist in [facility] in August.  Notes that when he does not take his terazosin  he has trouble with frequent urination and urinating.    Past Medical History:  Diagnosis Date   Degenerative arthritis    Elevated PSA    Headache    Lower back pain    per patient   Migraine    Osteoarthritis    Thyroid disease     Patient Active Problem List   Diagnosis Date Noted   Pre-diabetes 01/14/2017   Urinary hesitancy 01/14/2017   Hypothyroidism 01/04/2016    Past Surgical History:  Procedure Laterality Date   COLONOSCOPY     COLONOSCOPY WITH PROPOFOL  N/A 09/06/2019   Procedure: COLONOSCOPY WITH PROPOFOL ;  Surgeon: Therisa Bi, MD;  Location: [facility] ENDOSCOPY;  Service: Gastroenterology;  Laterality: N/A;       Home Medications    Prior to Admission medications   Medication Sig Start Date End Date Taking? Authorizing Provider  levofloxacin  (LEVAQUIN ) 500 MG tablet Take 1 tablet (500 mg total) by mouth daily for 7 days. 05/28/24 06/04/24 Yes  Wendall Isabell, DO  levothyroxine  (SYNTHROID ) 125 MCG tablet levothyroxine  125 mcg tablet   Yes [provider]  levothyroxine  (SYNTHROID ) 137 MCG tablet Take 137 mcg by mouth daily. 01/22/21  Yes [provider]  predniSONE  (STERAPRED UNI-PAK 21 TAB) 10 MG (21) TBPK tablet Take by mouth daily. Take 6 tabs by mouth daily for 1, then 5 tabs for 1 day, then 4 tabs for 1 day, then 3 tabs for 1 day, then 2 tabs for 1 day, then 1 tab for 1 day. 05/28/24  Yes Lorenda Grecco, DO  ibuprofen  (ADVIL ) 600 MG tablet Take 1 tablet (600 mg total) by mouth every 6 (six) hours as needed. 04/08/24   Triplett, Kirk B, FNP  promethazine -dextromethorphan (PROMETHAZINE -DM) 6.25-15 MG/5ML syrup Take 5 mLs by mouth 4 (four) times daily as needed. 05/18/24   Arvis Huxley B, PA-C  SUMAtriptan  (IMITREX ) 100 MG tablet Take 1 tablet (100 mg total) by mouth once as needed for migraine. May repeat in 2 hours if headache persists or recurs. 04/08/24 04/09/25  Triplett, Kirk B, FNP  terazosin  (HYTRIN ) 10 MG capsule Take 1 capsule (10 mg total) by mouth daily. 05/28/24 06/27/24  Maurya Nethery, DO    Family History Family History  Problem Relation Age of Onset   Diabetes type I Mother  Diabetes Father    Leukemia Father    Prostatitis Maternal Uncle    Prostate cancer Maternal Uncle     Social History Social History   Tobacco Use   Smoking status: Every Day    Current packs/day: 0.50    Average packs/day: 0.5 packs/day for 35.0 years (17.5 ttl pk-yrs)    Types: Cigarettes   Smokeless tobacco: Never  Vaping Use   Vaping status: Never Used  Substance Use Topics   Alcohol use: Yes    Comment: rarely   Drug use: Yes    Types: Marijuana    Comment: recovering crack addict-clean     Allergies   Patient has no known allergies.   Review of Systems Review of Systems: negative unless otherwise stated in HPI.      Physical Exam Triage Vital Signs ED Triage Vitals  Encounter Vitals Group     BP  05/28/24 1558 108/73     Girls Systolic BP Percentile --      Girls Diastolic BP Percentile --      Boys Systolic BP Percentile --      Boys Diastolic BP Percentile --      Pulse Rate 05/28/24 1558 67     Resp 05/28/24 1558 16     Temp 05/28/24 1558 98.6 F (37 C)     Temp Source 05/28/24 1558 Oral     SpO2 05/28/24 1558 96 %     Weight 05/28/24 1556 188 lb 0.8 oz (85.3 kg)     Height 05/28/24 1556 5' 11 (1.803 m)     Head Circumference --      Peak Flow --      Pain Score 05/28/24 1556 0     Pain Loc --      Pain Education --      Exclude from Growth Chart --    No data found.  Updated Vital Signs BP 108/73 (BP Location: Right Arm)   Pulse 67   Temp 98.6 F (37 C) (Oral)   Resp 16   Ht 5' 11 (1.803 m)   Wt 85.3 kg   SpO2 96%   BMI 26.23 kg/m   Visual Acuity Right Eye Distance:   Left Eye Distance:   Bilateral Distance:    Right Eye Near:   Left Eye Near:    Bilateral Near:     Physical Exam GEN:     alert, non-toxic appearing male in no distress    HENT:  mucus membranes moist, no nasal discharge EYES:   no scleral injection or discharge NECK:  normal ROM RESP:  no increased work of breathing, rhonchi bilaterally with cough CVS:   regular rate and rhythm Skin:   warm and dry, pustules and ingrown hairs on the posterior scalp    UC Treatments / Results  Labs (all labs ordered are listed, but only abnormal results are displayed) Labs Reviewed - No data to display  EKG   Radiology No results found.  Procedures Procedures (including critical care time)  Medications Ordered in UC Medications - No data to display  Initial Impression / Assessment and Plan / UC Course  I have reviewed the triage vital signs and the nursing notes.  Pertinent labs & imaging results that were available during my care of the patient were reviewed by me and considered in my medical decision making (see chart for details).       Pt is a 63 y.o. male who presents  for 4 weeks  of cough that is not improving.  [place] is  afebrile here without recent antipyretics. Satting well on room air. Overall pt is  non-toxic appearing, well hydrated, without respiratory distress. Pulmonary exam is remarkable for rhonchi that clear with cough.  After shared decision making, we will not pursue chest x-ray at this time.  COVID  and influenza testing deferred due to length of symptoms.   Treat acute bronchitis which is associated with chronic cigarette use with steroids and antibiotics as below.  Typical duration of symptoms discussed.  He is return to the urgent care if his cough does not resolve with conservative treatment.  He will need imaging of his chest at that time.  Folliculitis on posterior scalp.  Antibiotics as below.  Advised patient to use a warm towel prior to shaving to prevent ingrown hairs.   Patient has history of BPH.  Follows with urology at [facility].  Refilled his terazosin .  Precautions for hypotension given.  Return and ED precautions given and patient voiced understanding. Discussed MDM, treatment plan and plan for follow-up with patient who agrees with plan.      Final Clinical Impressions(s) / UC Diagnoses   Final diagnoses:  Bronchitis  Folliculitis  Benign prostatic hyperplasia, unspecified whether lower urinary tract symptoms present     Discharge Instructions      Stop by the pharmacy to pick up your prescriptions.  Follow up with your primary care provider or return to the urgent care, if not improving.   Remember to use a warm towel prior to shaving your head to prevent the hairs from being trapped. Clean your razors regularly.    Follow up with your urologist as scheduled.       ED Prescriptions     Medication Sig Dispense Auth. Provider   terazosin  (HYTRIN ) 10 MG capsule Take 1 capsule (10 mg total) by mouth daily. 30 capsule Santrice Muzio, DO   levofloxacin  (LEVAQUIN ) 500 MG tablet Take 1 tablet (500 mg total) by mouth  daily for 7 days. 7 tablet Chau Sawin, DO   predniSONE  (STERAPRED UNI-PAK 21 TAB) 10 MG (21) TBPK tablet Take by mouth daily. Take 6 tabs by mouth daily for 1, then 5 tabs for 1 day, then 4 tabs for 1 day, then 3 tabs for 1 day, then 2 tabs for 1 day, then 1 tab for 1 day. 21 tablet Oretha Weismann, DO      PDMP not reviewed this encounter.   Kriste Berth, DO 05/31/24 1012

## 2024-05-28 NOTE — ED Triage Notes (Signed)
 Pt c/o rash on the back of his head. He states he cut his hair about 3 days ago and it started after that.  Pt also states he is still coughing. He was seen on 05/28/24 for bronchitis.   Also requesting refill on Terazosin  10 mg daily. He states he has an appt with urology in August but will run out before his appt.

## 2024-06-22 ENCOUNTER — Ambulatory Visit
Admission: EM | Admit: 2024-06-22 | Discharge: 2024-06-22 | Disposition: A | Discharge status: Home / Self Care | Attending: Emergency Medicine | Admitting: Emergency Medicine

## 2024-06-22 ENCOUNTER — Encounter: Payer: Self-pay | Admitting: Emergency Medicine

## 2024-06-22 DIAGNOSIS — N400 Enlarged prostate without lower urinary tract symptoms: Secondary | ICD-10-CM | POA: Insufficient documentation

## 2024-06-22 DIAGNOSIS — M19012 Primary osteoarthritis, left shoulder: Secondary | ICD-10-CM | POA: Insufficient documentation

## 2024-06-22 DIAGNOSIS — Z760 Encounter for issue of repeat prescription: Secondary | ICD-10-CM | POA: Diagnosis not present

## 2024-06-22 DIAGNOSIS — M19011 Primary osteoarthritis, right shoulder: Secondary | ICD-10-CM | POA: Diagnosis not present

## 2024-06-22 LAB — BASIC METABOLIC PANEL WITH GFR
Anion gap: 7 (ref 5–15)
BUN: 12 mg/dL (ref 8–23)
CO2: 23 mmol/L (ref 22–32)
Calcium: 8.1 mg/dL — ABNORMAL LOW (ref 8.9–10.3)
Chloride: 108 mmol/L (ref 98–111)
Creatinine, Ser: 1.06 mg/dL (ref 0.61–1.24)
GFR, Estimated: >60 mL/min (ref >60)
Glucose, Bld: 105 mg/dL — ABNORMAL HIGH (ref 70–99)
Potassium: 4 mmol/L (ref 3.5–5.1)
Sodium: 138 mmol/L (ref 135–145)

## 2024-06-22 MED ORDER — CELECOXIB 100 MG PO CAPS: 100.0000 mg | ORAL_CAPSULE | Freq: Two times a day (BID) | ORAL | 0 refills | Status: AC

## 2024-06-22 MED ORDER — TERAZOSIN HCL 10 MG PO CAPS: 10.0000 mg | ORAL_CAPSULE | Freq: Every day | ORAL | 0 refills | Status: DC

## 2024-06-22 NOTE — ED Triage Notes (Signed)
 Pt c/o bilateral shoulder pain for several months. Pt denies any injury, but has a history of arthritis. He's taking an OTC natural supplement for joints.

## 2024-06-22 NOTE — Discharge Instructions (Signed)
 Celebrex  100 mg starting at once a day, may increase to twice a day for total of 200 mg in 24 hours.  May take at 1000 mg of Tylenol  up to 3 times a day as needed for pain.  You can try glucosamine, topical muscle rubs, OTC Salonpas 4% lidocaine  patches, physical therapy at [facility], heat.  follow-up with [facility] if symptoms worsen.  We will contact you if and only if your labs come back abnormal and we need to change management

## 2024-06-22 NOTE — ED Provider Notes (Signed)
 HPI  SUBJECTIVE:  Brett Santos is a right-handed 63 y.o. male who presents with bilateral shoulder pain for the past 2 to 3 months, worse on the right.  He describes it as sharp, intermittent, lasting seconds, present primarily with certain movements.  He states the pain is located deep in the shoulder, in the socket.  No bruising, swelling, fevers, erythema, crepitus, trauma to the shoulders.  No numbness/tingling/grip weakness bilaterally.  No change in his physical activity.  He states this is identical to the arthritis pain in both of his knees.  He started collagen 1 week ago with improvement in his symptoms.  Symptoms are better with use and heat, worse in the morning, exposure to cold temperature, and reaching backwards into the back seat to pick something up.  He is also requesting a refill on his terazosin .  He has an appoint with urology later this month.  He has a past medical history of osteoarthritis in both knees, migraine, hypothyroidism, BPH.  Denies history of rheumatoid arthritis, diabetes, hypertension, chronic kidney disease.  PCP: [facility].  Orthopedics: [facility].  Past Medical History:  Diagnosis Date   Degenerative arthritis    Elevated PSA    Headache    Lower back pain    per patient   Migraine    Osteoarthritis    Thyroid disease     Past Surgical History:  Procedure Laterality Date   COLONOSCOPY     COLONOSCOPY WITH PROPOFOL  N/A 09/06/2019   Procedure: COLONOSCOPY WITH PROPOFOL ;  Surgeon: Therisa Bi, MD;  Location: [facility] ENDOSCOPY;  Service: Gastroenterology;  Laterality: N/A;    Family History  Problem Relation Age of Onset   Diabetes type I Mother    Diabetes Father    Leukemia Father    Prostatitis Maternal Uncle    Prostate cancer Maternal Uncle     Social History   Tobacco Use   Smoking status: Every Day    Current packs/day: 0.50    Average packs/day: 0.5 packs/day for 35.0 years (17.5 ttl pk-yrs)    Types: Cigarettes    Smokeless tobacco: Never  Vaping Use   Vaping status: Never Used  Substance Use Topics   Alcohol use: Yes    Comment: rarely   Drug use: Yes    Types: Marijuana    Comment: recovering crack addict-clean    No current facility-administered medications for this encounter.  Current Outpatient Medications:    celecoxib  (CELEBREX ) 100 MG capsule, Take 1 capsule (100 mg total) by mouth 2 (two) times daily for 14 days., Disp: 28 capsule, Rfl: 0   levothyroxine  (SYNTHROID ) 125 MCG tablet, levothyroxine  125 mcg tablet, Disp: , Rfl:    levothyroxine  (SYNTHROID ) 137 MCG tablet, Take 137 mcg by mouth daily., Disp: , Rfl:    SUMAtriptan  (IMITREX ) 100 MG tablet, Take 1 tablet (100 mg total) by mouth once as needed for migraine. May repeat in 2 hours if headache persists or recurs., Disp: 12 tablet, Rfl: 0   terazosin  (HYTRIN ) 10 MG capsule, Take 1 capsule (10 mg total) by mouth daily., Disp: 30 capsule, Rfl: 0  No Known Allergies   ROS  As noted in HPI.   Physical Exam  BP 106/75 (BP Location: Left Arm)   Pulse 70   Temp 97.8 F (36.6 C) (Oral)   Resp 16   Wt 85.3 kg   SpO2 99%   BMI 26.22 kg/m   Constitutional: Well developed, well nourished, no acute distress Eyes:  EOMI, conjunctiva normal bilaterally HENT:  Normocephalic, atraumatic,mucus membranes moist Respiratory: Normal inspiratory effort Cardiovascular: Normal rate GI: nondistended skin: No rash, skin intact Musculoskeletal:  R shoulder with ROM normal.  Pain with abduction and reaching backwards.  Occasional crepitus.  Drop test normal, clavicle NT, A/C joint NT, scapula NT , proximal humerus NT , trapezius  NT , shoulder joint NT, Motor strength normal , Sensation intact LT over deltoid region, distal NVI with hand having intact sensation and strength in the median, radial, and ulnar nerve distribution.    no pain with internal rotation, no pain with external rotation, negative tenderness in bicipital groove, negative empty  can test,   negative liftoff test, pain but no instability with abduction/external rotation. RP 2+   L shoulder with ROM normal.  No pain with full range of motion.  No crepitus crepitus.  Drop test normal, clavicle NT, A/C joint NT, scapula NT , proximal humerus NT , trapezius  NT , shoulder joint NT, Motor strength normal , Sensation intact LT over deltoid region, distal NVI with hand having intact sensation and strength in the median, radial, and ulnar nerve distribution.    no pain with internal rotation, no pain with external rotation, negative tenderness in bicipital groove, negative empty can test,   negative liftoff test, no pain or instability with abduction/external rotation. RP 2+   Neurologic: Alert & oriented x 3, no focal neuro deficits Psychiatric: Speech and behavior appropriate   ED Course   Medications - No data to display  Orders Placed This Encounter  Procedures   Basic metabolic panel    Standing Status:   Standing    Number of Occurrences:   1   Recheck vitals    Please weigh patient    Standing Status:   Standing    Number of Occurrences:   1    Results for orders placed or performed during the hospital encounter of 06/22/24 (from the past 24 hours)  Basic metabolic panel     Status: Abnormal   Collection Time: 06/22/24 10:17 AM  Result Value Ref Range   Sodium 138 135 - 145 mmol/L   Potassium 4.0 3.5 - 5.1 mmol/L   Chloride 108 98 - 111 mmol/L   CO2 23 22 - 32 mmol/L   Glucose, Bld 105 (H) 70 - 99 mg/dL   BUN 12 8 - 23 mg/dL   Creatinine, Ser 8.93 0.61 - 1.24 mg/dL   Calcium 8.1 (L) 8.9 - 10.3 mg/dL   GFR, Estimated >39 >39 mL/min   Anion gap 7 5 - 15   No results found.  ED Clinical Impression  1. Osteoarthritis of right shoulder, unspecified osteoarthritis type   2. Osteoarthritis of left shoulder, unspecified osteoarthritis type   3. Medication refill   4. Benign prostatic hyperplasia, unspecified whether lower urinary tract symptoms present       ED Assessment/Plan     Last creatinine was 1.31 in May 2024.  Calculated creatinine clearance 71 mL/min, however will recheck BMP.  Will contact patient [phone] if abnormal and needing to change the dosing of his medication  Today's creatinine normal.  Calculated creatinine clearance today 87 mL/min  1.  Bilateral shoulder pain.  Suspect osteoarthritis.  Patient has full AROM bilaterally.  We talked about getting x-rays, but in the absence of trauma or limited motion, I doubt fracture or dislocation, and feel that they would show arthritis, and would not change management.  After shared medical decision making, patient has decided to defer that today.  Sending home with Celebrex  100 mg starting at once a day, may increase to twice a day to take as needed.  May take 1000 mg of Tylenol  up to 3 times a day as needed for pain.  He can try glucosamine, topical muscle rubs, OTC Salonpas 4% lidocaine  patches, physical therapy, heat.  Will give him information to [facility].  He will follow-up with [facility] where he is a current patient if symptoms worsen.  2.  Medication refill for BPH.  Patient requesting refill on terazosin .  Will give him 1 month supply.  Discussed  MDM, treatment plan, and plan for follow-up with patient.  patient agrees with plan.   Meds ordered this encounter  Medications   terazosin  (HYTRIN ) 10 MG capsule    Sig: Take 1 capsule (10 mg total) by mouth daily.    Dispense:  30 capsule    Refill:  0   celecoxib  (CELEBREX ) 100 MG capsule    Sig: Take 1 capsule (100 mg total) by mouth 2 (two) times daily for 14 days.    Dispense:  28 capsule    Refill:  0      *This clinic note was created using Scientist, clinical (histocompatibility and immunogenetics). Therefore, there may be occasional mistakes despite careful proofreading.  ?    Van Knee, MD 06/22/24 1044

## 2024-06-23 ENCOUNTER — Ambulatory Visit (HOSPITAL_COMMUNITY): Payer: Self-pay

## 2024-08-28 ENCOUNTER — Encounter: Payer: Self-pay | Admitting: Emergency Medicine

## 2024-08-28 ENCOUNTER — Emergency Department
Admission: EM | Admit: 2024-08-28 | Discharge: 2024-08-28 | Disposition: A | Discharge status: Home / Self Care | Attending: Emergency Medicine | Admitting: Emergency Medicine

## 2024-08-28 ENCOUNTER — Other Ambulatory Visit: Payer: Self-pay

## 2024-08-28 DIAGNOSIS — Z23 Encounter for immunization: Secondary | ICD-10-CM | POA: Insufficient documentation

## 2024-08-28 DIAGNOSIS — T22211A Burn of second degree of right forearm, initial encounter: Secondary | ICD-10-CM | POA: Diagnosis present

## 2024-08-28 DIAGNOSIS — X030XXA Exposure to flames in controlled fire, not in building or structure, initial encounter: Secondary | ICD-10-CM | POA: Insufficient documentation

## 2024-08-28 DIAGNOSIS — T22231A Burn of second degree of right upper arm, initial encounter: Secondary | ICD-10-CM | POA: Insufficient documentation

## 2024-08-28 DIAGNOSIS — E039 Hypothyroidism, unspecified: Secondary | ICD-10-CM | POA: Diagnosis not present

## 2024-08-28 DIAGNOSIS — T310 Burns involving less than 10% of body surface: Secondary | ICD-10-CM | POA: Insufficient documentation

## 2024-08-28 DIAGNOSIS — T300 Burn of unspecified body region, unspecified degree: Secondary | ICD-10-CM

## 2024-08-28 MED ORDER — BACITRACIN ZINC 500 UNIT/GM EX OINT
TOPICAL_OINTMENT | Freq: Two times a day (BID) | CUTANEOUS | Status: DC
  Filled 2024-08-28: qty 0.9

## 2024-08-28 MED ORDER — BACITRACIN 500 UNIT/GM EX OINT: 1.0000 | TOPICAL_OINTMENT | Freq: Two times a day (BID) | CUTANEOUS | 0 refills | Status: AC

## 2024-08-28 MED ORDER — TETANUS-DIPHTH-ACELL PERTUSSIS 5-2-15.5 LF-MCG/0.5 IM SUSP
0.5000 mL | Freq: Once | INTRAMUSCULAR | Status: AC
  Administered 2024-08-28: 0.5 mL via INTRAMUSCULAR
  Filled 2024-08-28: qty 0.5

## 2024-08-28 NOTE — ED Notes (Signed)
 Visual acuity screening: 20/20 each eye and both eyes.
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
## 2024-09-26 ENCOUNTER — Ambulatory Visit
Admission: EM | Admit: 2024-09-26 | Discharge: 2024-09-26 | Disposition: A | Discharge status: Home / Self Care | Attending: Emergency Medicine | Admitting: Emergency Medicine

## 2024-09-26 DIAGNOSIS — Z760 Encounter for issue of repeat prescription: Secondary | ICD-10-CM | POA: Diagnosis not present

## 2024-09-26 DIAGNOSIS — N400 Enlarged prostate without lower urinary tract symptoms: Secondary | ICD-10-CM

## 2024-09-26 MED ORDER — TERAZOSIN HCL 10 MG PO CAPS: 10.0000 mg | ORAL_CAPSULE | Freq: Two times a day (BID) | ORAL | 0 refills | Status: AC

## 2024-09-26 NOTE — Discharge Instructions (Signed)
 I am giving you a months worth of your terazosin  twice a day.  Please follow-up with your urologist as scheduled.  Further refills will need to come from them or your primary care provider.

## 2024-09-26 NOTE — ED Provider Notes (Signed)
 HPI  SUBJECTIVE:  Brett Santos is a 63 y.o. male who presents for medication refill.  He was requesting a refill on terazosin  10 mg twice daily.  He states that he has been on this dose for some time and has done well with it without any side effects.  He ran out of it today, and is concerned because he has significant difficulty urinating without it.  States he has an appointment with his urologist at [facility] later this month. Patient has a past medical history of osteoarthritis, migraine, BPH.  Past Medical History:  Diagnosis Date   Degenerative arthritis    Elevated PSA    Headache    Lower back pain    per patient   Migraine    Osteoarthritis    Thyroid disease     Past Surgical History:  Procedure Laterality Date   COLONOSCOPY     COLONOSCOPY WITH PROPOFOL  N/A 09/06/2019   Procedure: COLONOSCOPY WITH PROPOFOL ;  Surgeon: Therisa Bi, MD;  Location: [facility] ENDOSCOPY;  Service: Gastroenterology;  Laterality: N/A;    Family History  Problem Relation Age of Onset   Diabetes type I Mother    Diabetes Father    Leukemia Father    Prostatitis Maternal Uncle    Prostate cancer Maternal Uncle     Social History   Tobacco Use   Smoking status: Every Day    Current packs/day: 0.50    Average packs/day: 0.5 packs/day for 35.0 years (17.5 ttl pk-yrs)    Types: Cigarettes   Smokeless tobacco: Never  Vaping Use   Vaping status: Never Used  Substance Use Topics   Alcohol use: Yes    Comment: rarely   Drug use: Yes    Types: Marijuana    Comment: recovering crack addict-clean    No current facility-administered medications for this encounter.  Current Outpatient Medications:    levothyroxine  (SYNTHROID ) 125 MCG tablet, levothyroxine  125 mcg tablet, Disp: , Rfl:    bacitracin  500 UNIT/GM ointment, Apply 1 Application topically 2 (two) times daily., Disp: 15 g, Rfl: 0   levothyroxine  (SYNTHROID ) 137 MCG tablet, Take 137 mcg by mouth daily., Disp: , Rfl:     SUMAtriptan  (IMITREX ) 100 MG tablet, Take 1 tablet (100 mg total) by mouth once as needed for migraine. May repeat in 2 hours if headache persists or recurs., Disp: 12 tablet, Rfl: 0   terazosin  (HYTRIN ) 10 MG capsule, Take 1 capsule (10 mg total) by mouth in the morning and at bedtime., Disp: 60 capsule, Rfl: 0  No Known Allergies   ROS  As noted in HPI.   Physical Exam  BP 109/72 (BP Location: Left Arm)   Pulse 100   Temp 98.6 F (37 C) (Oral)   Wt 82.7 kg   SpO2 95%   BMI 25.43 kg/m   Constitutional: Well developed, well nourished, no acute distress Eyes:  EOMI, conjunctiva normal bilaterally HENT: Normocephalic, atraumatic,mucus membranes moist Respiratory: Normal inspiratory effort Cardiovascular: Normal rate GI: nondistended skin: No rash, skin intact Musculoskeletal: no deformities Neurologic: Alert & oriented x 3, no focal neuro deficits Psychiatric: Speech and behavior appropriate   ED Course   Medications - No data to display  No orders of the defined types were placed in this encounter.   No results found for this or any previous visit (from the past 24 hours). No results found.  ED Clinical Impression  1. Benign prostatic hyperplasia, unspecified whether lower urinary tract symptoms present   2. Encounter  for medication refill      ED Assessment/Plan     Patient states he takes terazosin  10 mg p.o. twice daily, has been on this dose for some time and has done well with it without any side effects.  States that he has an appointment with [facility] urology in [place] this month.  Will give him a months refill.  Advised that further refills will have to come from urology/his PCP.  Discussed, MDM, treatment plan, and plan for follow-up with patient. patient agrees with plan.   Meds ordered this encounter  Medications   terazosin  (HYTRIN ) 10 MG capsule    Sig: Take 1 capsule (10 mg total) by mouth in the morning and at bedtime.    Dispense:  60  capsule    Refill:  0      *This clinic note was created using Scientist, clinical (histocompatibility and immunogenetics). Therefore, there may be occasional mistakes despite careful proofreading.  ?    Van Knee, MD 09/26/24 [phone]

## 2024-09-26 NOTE — ED Triage Notes (Signed)
 Pt c/o prescription refill of Terazosin  10mg   Pt states that he has a urology appointment scheduled this month.  Pt states that he took his last pill yesterday.

## 2024-10-29 ENCOUNTER — Encounter: Payer: Self-pay | Admitting: Emergency Medicine

## 2024-10-29 ENCOUNTER — Ambulatory Visit
Admission: EM | Admit: 2024-10-29 | Discharge: 2024-10-29 | Disposition: A | Discharge status: Home / Self Care | Attending: Emergency Medicine | Admitting: Emergency Medicine

## 2024-10-29 DIAGNOSIS — Z760 Encounter for issue of repeat prescription: Secondary | ICD-10-CM

## 2024-10-29 MED ORDER — LEVOTHYROXINE SODIUM 175 MCG PO TABS: 175.0000 ug | ORAL_TABLET | Freq: Every day | ORAL | 0 refills | Status: AC

## 2024-10-29 NOTE — Discharge Instructions (Addendum)
 Resume taking your levothyroxine  daily as scheduled.  Make sure that you are taking it away from other medications.  Keep your appointment with your primary care provider in January as you will need blood work to check your TSH and T4 levels to determine if you need to stay on the same dosage or if you need a dosage adjustment.

## 2024-10-29 NOTE — ED Triage Notes (Signed)
 Patient needs refill on his Levothyroxine .  Patient has been out for the past 2-3 days.  Patient has appointment scheduled next month with his PCP.

## 2024-10-29 NOTE — ED Provider Notes (Signed)
 [facility]    CSN: [id number] Arrival date & time: 10/29/24  1421      History   Chief Complaint Chief Complaint  Patient presents with   Medication Refill    HPI Brett Santos is a 63 y.o. male.   HPI  63 year old male with past medical history significant for hypothyroidism, osteoarthritis, migraine headaches, and elevated PSA presents with request for refill of levothyroxine .  He reports that he has been out for last 2 to 3 days and he started to feel fatigued.  He has an appointment coming up with his PCP in January at [facility] [place].  Past Medical History:  Diagnosis Date   Degenerative arthritis    Elevated PSA    Headache    Lower back pain    per patient   Migraine    Osteoarthritis    Thyroid disease     Patient Active Problem List   Diagnosis Date Noted   Pre-diabetes 01/14/2017   Urinary hesitancy 01/14/2017   Hypothyroidism 01/04/2016    Past Surgical History:  Procedure Laterality Date   COLONOSCOPY     COLONOSCOPY WITH PROPOFOL  N/A 09/06/2019   Procedure: COLONOSCOPY WITH PROPOFOL ;  Surgeon: Therisa Bi, MD;  Location: [facility] ENDOSCOPY;  Service: Gastroenterology;  Laterality: N/A;       Home Medications    Prior to Admission medications  Medication Sig Start Date End Date Taking? Authorizing Provider  bacitracin  500 UNIT/GM ointment Apply 1 Application topically 2 (two) times daily. 08/28/24   Poggi, Jenna E, PA-C  levothyroxine  (SYNTHROID ) 175 MCG tablet Take 1 tablet (175 mcg total) by mouth daily. 10/29/24   Bernardino Ditch, NP  SUMAtriptan  (IMITREX ) 100 MG tablet Take 1 tablet (100 mg total) by mouth once as needed for migraine. May repeat in 2 hours if headache persists or recurs. 04/08/24 04/09/25  Triplett, Kirk B, FNP  terazosin  (HYTRIN ) 10 MG capsule Take 1 capsule (10 mg total) by mouth in the morning and at bedtime. 09/26/24 10/26/24  Van Knee, MD    Family History Family History  Problem Relation Age of Onset    Diabetes type I Mother    Diabetes Father    Leukemia Father    Prostatitis Maternal Uncle    Prostate cancer Maternal Uncle     Social History Social History[1]   Allergies   Patient has no known allergies.   Review of Systems Review of Systems  Constitutional:  Positive for fatigue.     Physical Exam Triage Vital Signs ED Triage Vitals  Encounter Vitals Group     BP      Girls Systolic BP Percentile      Girls Diastolic BP Percentile      Boys Systolic BP Percentile      Boys Diastolic BP Percentile      Pulse      Resp      Temp      Temp src      SpO2      Weight      Height      Head Circumference      Peak Flow      Pain Score      Pain Loc      Pain Education      Exclude from Growth Chart    No data found.  Updated Vital Signs BP 112/82 (BP Location: Right Arm)   Pulse 79   Temp 97.8 F (36.6 C) (Oral)   Resp 15  Ht 5' 11 (1.803 m)   Wt 182 lb 5.1 oz (82.7 kg)   SpO2 98%   BMI 25.43 kg/m   Visual Acuity Right Eye Distance:   Left Eye Distance:   Bilateral Distance:    Right Eye Near:   Left Eye Near:    Bilateral Near:     Physical Exam Vitals and nursing note reviewed.  Constitutional:      Appearance: Normal appearance.  Skin:    General: Skin is warm and dry.     Capillary Refill: Capillary refill takes less than 2 seconds.     Findings: No rash.  Neurological:     General: No focal deficit present.     Mental Status: He is alert and oriented to person, place, and time.      UC Treatments / Results  Labs (all labs ordered are listed, but only abnormal results are displayed) Labs Reviewed - No data to display  EKG   Radiology No results found.  Procedures Procedures (including critical care time)  Medications Ordered in UC Medications - No data to display  Initial Impression / Assessment and Plan / UC Course  I have reviewed the triage vital signs and the nursing notes.  Pertinent labs & imaging  results that were available during my care of the patient were reviewed by me and considered in my medical decision making (see chart for details).   Patient is a nontoxic-appearing 63 year old male presenting with request for refill of his levothyroxine .  He is currently on 175 mcg and he ran out of refills.  He does not have an appointment with his PCP until January.  He goes to [facility].  I advised him that I can give him a 30-day supply but that he will need to follow-up with his PCP as he will need blood work to check his thyroid and TSH levels as he may need a dosage adjustment.   Final Clinical Impressions(s) / UC Diagnoses   Final diagnoses:  Medication refill     Discharge Instructions      Resume taking your levothyroxine  daily as scheduled.  Make sure that you are taking it away from other medications.  Keep your appointment with your primary care provider in January as you will need blood work to check your TSH and T4 levels to determine if you need to stay on the same dosage or if you need a dosage adjustment.     ED Prescriptions     Medication Sig Dispense Auth. Provider   levothyroxine  (SYNTHROID ) 175 MCG tablet Take 1 tablet (175 mcg total) by mouth daily. 30 tablet Bernardino Ditch, NP      PDMP not reviewed this encounter.    [1]  Social History Tobacco Use   Smoking status: Every Day    Current packs/day: 0.50    Average packs/day: 0.5 packs/day for 35.0 years (17.5 ttl pk-yrs)    Types: Cigarettes   Smokeless tobacco: Never  Vaping Use   Vaping status: Never Used  Substance Use Topics   Alcohol use: Yes    Comment: rarely   Drug use: Yes    Types: Marijuana    Comment: recovering crack addict-clean     Bernardino Ditch, NP 10/29/24 1506

## 2024-11-08 ENCOUNTER — Ambulatory Visit
Admission: EM | Admit: 2024-11-08 | Discharge: 2024-11-08 | Disposition: A | Discharge status: Home / Self Care | Attending: Family Medicine | Admitting: Family Medicine

## 2024-11-08 DIAGNOSIS — J209 Acute bronchitis, unspecified: Secondary | ICD-10-CM

## 2024-11-08 DIAGNOSIS — J069 Acute upper respiratory infection, unspecified: Secondary | ICD-10-CM | POA: Diagnosis not present

## 2024-11-08 DIAGNOSIS — F17210 Nicotine dependence, cigarettes, uncomplicated: Secondary | ICD-10-CM

## 2024-11-08 LAB — POCT INFLUENZA A/B
Influenza A, POC: NEGATIVE
Influenza B, POC: NEGATIVE

## 2024-11-08 LAB — POC SOFIA SARS ANTIGEN FIA: SARS Coronavirus 2 Ag: NEGATIVE

## 2024-11-08 MED ORDER — AZITHROMYCIN 250 MG PO TABS: ORAL_TABLET | ORAL | 0 refills | Status: AC

## 2024-11-08 MED ORDER — PREDNISONE 50 MG PO TABS: 50.0000 mg | ORAL_TABLET | Freq: Every day | ORAL | 0 refills | Status: AC

## 2024-11-08 NOTE — ED Triage Notes (Signed)
 Pt c/o cough,chest congestion & bodyaches x3 days. Denies fevers. No OTC meds.

## 2024-11-08 NOTE — ED Provider Notes (Signed)
 " [facility]    CSN: [id number] Arrival date & time: 11/08/24  1632      History   Chief Complaint Chief Complaint  Patient presents with   Cough    HPI Brett Santos is a 63 y.o. male.   HPI  History obtained from the patient. Brett Santos presents for 2 -3 days of productive cough and sleep disturbance.  He has some dizziness and shortness of breath with prolonged coughing fits. No body aches but feels tired.  Endorses rhinorrhea.  Denies fever, headache, sore throat, vomiting and diarrhea. He went to church yesterday but no known sick contacts.    He is current cigarette smoker.       Past Medical History:  Diagnosis Date   Degenerative arthritis    Elevated PSA    Headache    Lower back pain    per patient   Migraine    Osteoarthritis    Thyroid disease     Patient Active Problem List   Diagnosis Date Noted   Pre-diabetes 01/14/2017   Urinary hesitancy 01/14/2017   MDD (major depressive disorder), recurrent episode, moderate (HCC) 06/15/2014   PTSD (post-traumatic stress disorder) 06/15/2014   Excessive anger 01/26/2008   Hypothyroidism 02/24/2007    Past Surgical History:  Procedure Laterality Date   COLONOSCOPY     COLONOSCOPY WITH PROPOFOL  N/A 09/06/2019   Procedure: COLONOSCOPY WITH PROPOFOL ;  Surgeon: Therisa Bi, MD;  Location: [facility] ENDOSCOPY;  Service: Gastroenterology;  Laterality: N/A;       Home Medications    Prior to Admission medications  Medication Sig Start Date End Date Taking? Authorizing Provider  azithromycin  (ZITHROMAX  Z-PAK) 250 MG tablet Take 2 tablets on day 1 then 1 tablet daily 11/08/24  Yes Orie Cuttino, DO  predniSONE  (DELTASONE ) 50 MG tablet Take 1 tablet (50 mg total) by mouth daily for 5 days. 11/08/24 11/13/24 Yes Eimi Viney, DO  bacitracin  500 UNIT/GM ointment Apply 1 Application topically 2 (two) times daily. 08/28/24   Poggi, Jenna E, PA-C  levothyroxine  (SYNTHROID ) 175 MCG tablet Take 1 tablet  (175 mcg total) by mouth daily. 10/29/24   Bernardino Ditch, NP  SUMAtriptan  (IMITREX ) 100 MG tablet Take 1 tablet (100 mg total) by mouth once as needed for migraine. May repeat in 2 hours if headache persists or recurs. 04/08/24 04/09/25  Triplett, Kirk NOVAK, FNP  terazosin  (HYTRIN ) 10 MG capsule Take 1 capsule (10 mg total) by mouth in the morning and at bedtime. 09/26/24 10/26/24  Van Knee, MD    Family History Family History  Problem Relation Age of Onset   Diabetes type I Mother    Diabetes Father    Leukemia Father    Prostatitis Maternal Uncle    Prostate cancer Maternal Uncle     Social History Social History[1]   Allergies   Patient has no known allergies.   Review of Systems Review of Systems: negative unless otherwise stated in HPI.      Physical Exam Triage Vital Signs ED Triage Vitals  Encounter Vitals Group     BP 11/08/24 1755 113/72     Girls Systolic BP Percentile --      Girls Diastolic BP Percentile --      Boys Systolic BP Percentile --      Boys Diastolic BP Percentile --      Pulse Rate 11/08/24 1755 90     Resp 11/08/24 1755 18     Temp 11/08/24 1755 99.1 F (37.3 C)  Temp Source 11/08/24 1755 Oral     SpO2 11/08/24 1755 95 %     Weight 11/08/24 1752 189 lb 9.6 oz (86 kg)     Height --      Head Circumference --      Peak Flow --      Pain Score 11/08/24 1754 0     Pain Loc --      Pain Education --      Exclude from Growth Chart --    No data found.  Updated Vital Signs BP 113/72 (BP Location: Right Arm)   Pulse 90   Temp 99.1 F (37.3 C) (Oral)   Resp 18   Wt 86 kg   SpO2 95%   BMI 26.44 kg/m   Visual Acuity Right Eye Distance:   Left Eye Distance:   Bilateral Distance:    Right Eye Near:   Left Eye Near:    Bilateral Near:     Physical Exam GEN:     alert, non-toxic appearing male in no distress    HENT:  mucus membranes moist, no nasal discharge EYES:   no scleral injection or discharge  RESP:  no increased  work of breathing, rhonchi bilaterally CVS:   regular rate and rhythm Skin:   warm and dry, no rash on visible skin    UC Treatments / Results  Labs (all labs ordered are listed, but only abnormal results are displayed) Labs Reviewed  POC SOFIA SARS ANTIGEN FIA - Normal  POCT INFLUENZA A/B - Normal    EKG   Radiology No results found.   Procedures Procedures (including critical care time)  Medications Ordered in UC Medications - No data to display  Initial Impression / Assessment and Plan / UC Course  I have reviewed the triage vital signs and the nursing notes.  Pertinent labs & imaging results that were available during my care of the patient were reviewed by me and considered in my medical decision making (see chart for details).       Pt is a 63 y.o. male who presents for 2-3 days of respiratory symptoms. Sayf is afebrile here without recent antipyretics. Satting well on room air. Overall pt is non-toxic appearing, well hydrated, without respiratory distress. Pulmonary exam is unremarkable.  POC COVID and POC influenza panel obtained and were negative.   Suspect viral respiratory illness however given history of smoking with thick sputum will cover for bronchitis with steroids and antibiotics as below. Typical duration of symptoms discussed.   Return and ED precautions given and voiced understanding. Discussed MDM, treatment plan and plan for follow-up with patient who agrees with plan.     Final Clinical Impressions(s) / UC Diagnoses   Final diagnoses:  Cigarette smoker  URI with cough and congestion  Acute bronchitis, unspecified organism     Discharge Instructions      Your COVID and influenza tests are negative.   Stop by the pharmacy to pick up your prescriptions.  Follow up with your primary care provider or return to the urgent care, if not improving.       ED Prescriptions     Medication Sig Dispense Auth. Provider   predniSONE   (DELTASONE ) 50 MG tablet Take 1 tablet (50 mg total) by mouth daily for 5 days. 5 tablet Laurna Shetley, DO   azithromycin  (ZITHROMAX  Z-PAK) 250 MG tablet Take 2 tablets on day 1 then 1 tablet daily 6 tablet Kriste Berth, DO      PDMP  not reviewed this encounter.     [1]  Social History Tobacco Use   Smoking status: Every Day    Current packs/day: 0.50    Average packs/day: 0.5 packs/day for 35.0 years (17.5 ttl pk-yrs)    Types: Cigarettes   Smokeless tobacco: Never  Vaping Use   Vaping status: Never Used  Substance Use Topics   Alcohol use: Yes    Comment: rarely   Drug use: Yes    Types: Marijuana    Comment: recovering crack addict-clean     Kriste Berth, DO 11/08/24 1834  "

## 2024-11-08 NOTE — Discharge Instructions (Addendum)
 Your COVID and influenza tests are negative.  Stop by the pharmacy to pick up your prescriptions.  Follow up with your primary care provider or return to the urgent care, if not improving.

## 2024-11-09 ENCOUNTER — Other Ambulatory Visit: Payer: Self-pay

## 2024-11-09 ENCOUNTER — Emergency Department
Admission: EM | Admit: 2024-11-09 | Discharge: 2024-11-09 | Disposition: A | Discharge status: Home / Self Care | Attending: Emergency Medicine | Admitting: Emergency Medicine

## 2024-11-09 ENCOUNTER — Emergency Department

## 2024-11-09 DIAGNOSIS — F17210 Nicotine dependence, cigarettes, uncomplicated: Secondary | ICD-10-CM | POA: Insufficient documentation

## 2024-11-09 DIAGNOSIS — E039 Hypothyroidism, unspecified: Secondary | ICD-10-CM | POA: Diagnosis not present

## 2024-11-09 DIAGNOSIS — J209 Acute bronchitis, unspecified: Secondary | ICD-10-CM | POA: Diagnosis not present

## 2024-11-09 DIAGNOSIS — J111 Influenza due to unidentified influenza virus with other respiratory manifestations: Secondary | ICD-10-CM

## 2024-11-09 DIAGNOSIS — R059 Cough, unspecified: Secondary | ICD-10-CM | POA: Diagnosis present

## 2024-11-09 MED ORDER — ALBUTEROL SULFATE HFA 108 (90 BASE) MCG/ACT IN AERS: 2.0000 | INHALATION_SPRAY | Freq: Four times a day (QID) | RESPIRATORY_TRACT | 1 refills | Status: AC | PRN

## 2024-11-09 MED ORDER — ONDANSETRON 4 MG PO TBDP
4.0000 mg | ORAL_TABLET | Freq: Once | ORAL | Status: AC
  Administered 2024-11-09: 4 mg via ORAL
  Filled 2024-11-09: qty 1

## 2024-11-09 NOTE — ED Provider Notes (Signed)
 "  [facility] Provider Note    Event Date/Time   First MD Initiated Contact with Patient 11/09/24 [phone]     (approximate)   History   Cough   HPI  Brett Santos is a 63 y.o. male   presents to the ED with complaint of cough for the last 3 days.  Patient was seen yesterday at urgent care at [facility] where he was tested for respiratory illnesses and told that they were all negative.  Patient was prescribed Zithromax  and prednisone .  Patient did not pick up the medication but came to the emergency department to see if we could do anything for him until the pharmacy opens.  Patient complains of persistent cough.  No over-the-counter medications have been taken.  Patient is a smoker at half a pack cigarettes per day.  He has a history of prediabetes, hypothyroidism, major depressive disorder and PTSD.      Physical Exam   Triage Vital Signs: ED Triage Vitals  Encounter Vitals Group     BP 11/09/24 0757 128/80     Girls Systolic BP Percentile --      Girls Diastolic BP Percentile --      Boys Systolic BP Percentile --      Boys Diastolic BP Percentile --      Pulse Rate 11/09/24 0757 86     Resp 11/09/24 0757 18     Temp 11/09/24 0757 98.6 F (37 C)     Temp src --      SpO2 11/09/24 0757 98 %     Weight 11/09/24 0758 190 lb (86.2 kg)     Height 11/09/24 0758 5' 11 (1.803 m)     Head Circumference --      Peak Flow --      Pain Score 11/09/24 0755 0     Pain Loc --      Pain Education --      Exclude from Growth Chart --     Most recent vital signs: Vitals:   11/09/24 0757  BP: 128/80  Pulse: 86  Resp: 18  Temp: 98.6 F (37 C)  SpO2: 98%     General: Awake, no distress.  Able to speak in complete sentences without any difficulty. CV:  Good peripheral perfusion.  Heart rate and rate rhythm. Resp:  Normal effort.  Lungs clear without wheeze or rhonchi.  Patient does have a deep congested cough. Abd:  No distention.  Other:     ED Results  / Procedures / Treatments   Labs (all labs ordered are listed, but only abnormal results are displayed) Labs Reviewed - No data to display   RADIOLOGY Chest x-ray per radiology was hyperexpansion with bibasilar atelectasis    PROCEDURES:  Critical Care performed:   Procedures   MEDICATIONS ORDERED IN ED: Medications  ondansetron  (ZOFRAN -ODT) disintegrating tablet 4 mg (4 mg Oral Given 11/09/24 0841)     IMPRESSION / MDM / ASSESSMENT AND PLAN / ED COURSE  I reviewed the triage vital signs and the nursing notes.   Differential diagnosis includes, but is not limited to, bronchitis, pneumonia, influenza, viral-like illness.  63 year old male presents to the ED with complaint of worsening cough.  He was seen yesterday at urgent care where his respiratory panel was negative.  Patient was prescribed azithromycin  and prednisone  but has not picked the prescriptions up.  Chest x-ray today was negative.  I did discuss discontinuation of his smoking and a prescription for albuterol  was  sent to the pharmacy.  He plans to pick up the prescription for Zithromax  and prednisone  at the same time.  He is to follow-up with his PCP if any continued problems or return to the emergency department if any severe worsening of his symptoms.      Patient's presentation is most consistent with acute complicated illness / injury requiring diagnostic workup.  FINAL CLINICAL IMPRESSION(S) / ED DIAGNOSES   Final diagnoses:  Acute bronchitis, unspecified organism  Influenza-like illness     Rx / DC Orders   ED Discharge Orders          Ordered    albuterol  (VENTOLIN  HFA) 108 (90 Base) MCG/ACT inhaler  Every 6 hours PRN        11/09/24 0902             Note:  This document was prepared using Dragon voice recognition software and may include unintentional dictation errors.   Saunders Shona CROME, PA-C 11/09/24 1004    Bradler, Evan K, MD 11/09/24 [phone]  "

## 2024-11-09 NOTE — ED Triage Notes (Signed)
 Pt comes with cough since Saturday. Pt states worse Sunday night. Pt went to UC yesterday and they tested him and he was neg for all. Pt was prescribed meds but too late to get filled last night at [facility].

## 2024-11-09 NOTE — Discharge Instructions (Addendum)
 Follow-up with your primary care provider if any continued problems.  Begin taking your antibiotic and steroids as directed.  An inhaler was sent for you to use every 6 hours as needed for any wheezing, shortness of breath or coughing.  Increase fluids to stay hydrated.  Please consider decreasing or stopping your smoking which will help a great deal.

## 2024-12-09 ENCOUNTER — Ambulatory Visit
Admission: EM | Admit: 2024-12-09 | Discharge: 2024-12-09 | Disposition: A | Discharge status: Home / Self Care | Attending: Emergency Medicine | Admitting: Emergency Medicine

## 2024-12-09 DIAGNOSIS — H6122 Impacted cerumen, left ear: Secondary | ICD-10-CM | POA: Diagnosis not present

## 2024-12-09 DIAGNOSIS — F17200 Nicotine dependence, unspecified, uncomplicated: Secondary | ICD-10-CM

## 2024-12-09 DIAGNOSIS — J40 Bronchitis, not specified as acute or chronic: Secondary | ICD-10-CM | POA: Diagnosis not present

## 2024-12-09 MED ORDER — CARBAMIDE PEROXIDE 6.5 % OT SOLN: 5.0000 [drp] | Freq: Two times a day (BID) | OTIC | 0 refills | Status: AC

## 2024-12-09 NOTE — ED Triage Notes (Signed)
 Patient needing a refill of his thyroid medication. Has been out of it for 3 days.  Patient also having a cough. Has a history of bronchitis. Denies any other cold symptoms at this time. Productive cough ongoing currently for over a week.   Otc cough medications with no relief.

## 2024-12-09 NOTE — ED Notes (Signed)
 Patient left prior to receiving discharge paperwork. Not visualized in lobby.

## 2024-12-09 NOTE — ED Provider Notes (Signed)
 " [facility]    CSN: [id number] Arrival date & time: 12/09/24  1009      History   Chief Complaint Chief Complaint  Patient presents with   Medication Refill   Cough    HPI Brett Santos is a 64 y.o. male.   64 year old male pt, Brett Santos, presents to urgent care for evaluation of bronchitis flareup with cough x 3 days, patient reports he has taken over-the-counter cough meds without relief.  Patient also requesting refill of thyroid medication, patient was seen last month here in urgent care for same and was given a refill of his thyroid medication was advised at that time to follow-up with his PCP because he would need lab work completed and further management by PCP.    PMH: Prediabetes, hypothyroidism, major depressive disorder and PTSD.  The history is provided by the patient. No language interpreter was used.    Past Medical History:  Diagnosis Date   Degenerative arthritis    Elevated PSA    Headache    Lower back pain    per patient   Migraine    Osteoarthritis    Thyroid disease     Patient Active Problem List   Diagnosis Date Noted   Bronchitis 12/09/2024   Smoker 12/09/2024   Cerumen debris on tympanic membrane of left ear 12/09/2024   Pre-diabetes 01/14/2017   Urinary hesitancy 01/14/2017   MDD (major depressive disorder), recurrent episode, moderate (HCC) 06/15/2014   PTSD (post-traumatic stress disorder) 06/15/2014   Excessive anger 01/26/2008   Hypothyroidism 02/24/2007    Past Surgical History:  Procedure Laterality Date   COLONOSCOPY     COLONOSCOPY WITH PROPOFOL  N/A 09/06/2019   Procedure: COLONOSCOPY WITH PROPOFOL ;  Surgeon: Therisa Bi, MD;  Location: [facility] ENDOSCOPY;  Service: Gastroenterology;  Laterality: N/A;       Home Medications    Prior to Admission medications  Medication Sig Start Date End Date Taking? Authorizing Provider  carbamide peroxide (DEBROX) 6.5 % OTIC solution Place 5 drops into the left ear 2  (two) times daily. 12/09/24  Yes Lashunta Frieden, Rilla, NP  albuterol  (VENTOLIN  HFA) 108 (90 Base) MCG/ACT inhaler Inhale 2 puffs into the lungs every 6 (six) hours as needed for wheezing or shortness of breath. Or coughing 11/09/24   Saunders Shona CROME, PA-C  azithromycin  (ZITHROMAX  Z-PAK) 250 MG tablet Take 2 tablets on day 1 then 1 tablet daily 11/08/24   Brimage, Vondra, DO  bacitracin  500 UNIT/GM ointment Apply 1 Application topically 2 (two) times daily. 08/28/24   Poggi, Jenna E, PA-C  levothyroxine  (SYNTHROID ) 175 MCG tablet Take 1 tablet (175 mcg total) by mouth daily. 10/29/24   Bernardino Ditch, NP  SUMAtriptan  (IMITREX ) 100 MG tablet Take 1 tablet (100 mg total) by mouth once as needed for migraine. May repeat in 2 hours if headache persists or recurs. 04/08/24 04/09/25  Triplett, Kirk B, FNP  terazosin  (HYTRIN ) 10 MG capsule Take 1 capsule (10 mg total) by mouth in the morning and at bedtime. 09/26/24 10/26/24  Van Knee, MD    Family History Family History  Problem Relation Age of Onset   Diabetes type I Mother    Diabetes Father    Leukemia Father    Prostatitis Maternal Uncle    Prostate cancer Maternal Uncle     Social History Social History[1]   Allergies   Patient has no known allergies.   Review of Systems Review of Systems  Constitutional:  Negative for fever.  HENT:  Negative for congestion.   Respiratory:  Positive for cough. Negative for shortness of breath, wheezing and stridor.   Cardiovascular:  Negative for chest pain and palpitations.  Psychiatric/Behavioral:  Positive for agitation.   All other systems reviewed and are negative.    Physical Exam Triage Vital Signs ED Triage Vitals  Encounter Vitals Group     BP      Girls Systolic BP Percentile      Girls Diastolic BP Percentile      Boys Systolic BP Percentile      Boys Diastolic BP Percentile      Pulse      Resp      Temp      Temp src      SpO2      Weight      Height      Head  Circumference      Peak Flow      Pain Score      Pain Loc      Pain Education      Exclude from Growth Chart    No data found.  Updated Vital Signs BP 104/70 (BP Location: Right Arm)   Pulse (!) 108   Temp 98.5 F (36.9 C) (Oral)   Resp 18   SpO2 97%   Visual Acuity Right Eye Distance:   Left Eye Distance:   Bilateral Distance:    Right Eye Near:   Left Eye Near:    Bilateral Near:     Physical Exam Vitals and nursing note reviewed.  Constitutional:      Appearance: He is well-developed and well-groomed. He is not ill-appearing or toxic-appearing.  HENT:     Head: Normocephalic.     Right Ear: Tympanic membrane is not retracted.     Left Ear: There is impacted cerumen.     Nose: Mucosal edema and congestion present.     Mouth/Throat:     Lips: Pink.     Mouth: Mucous membranes are moist.     Pharynx: Oropharynx is clear. Uvula midline.  Cardiovascular:     Rate and Rhythm: Regular rhythm. Tachycardia present.     Heart sounds: Normal heart sounds.  Pulmonary:     Effort: Pulmonary effort is normal.     Breath sounds: Normal breath sounds and air entry.     Comments: R 18 unlabored, 02 sat 97% on room air, no wheezing Neurological:     General: No focal deficit present.     Mental Status: He is alert and oriented to person, place, and time.     GCS: GCS eye subscore is 4. GCS verbal subscore is 5. GCS motor subscore is 6.  Psychiatric:        Mood and Affect: Affect is labile, angry and inappropriate.        Behavior: Behavior is agitated and aggressive.      UC Treatments / Results  Labs (all labs ordered are listed, but only abnormal results are displayed) Labs Reviewed - No data to display  EKG   Radiology No results found.  Procedures Procedures (including critical care time)  Medications Ordered in UC Medications - No data to display  Initial Impression / Assessment and Plan / UC Course  I have reviewed the triage vital signs and the  nursing notes.  Pertinent labs & imaging results that were available during my care of the patient were reviewed by me and considered in my medical decision making (see  chart for details).    MDM: Upon entry to room patient became upset and said  You aggressively came into the room and I do not like that, I apologized but redirected and reassured pt I had knocked and announced myself upon entry, as CMA, Arianna, was attempting to leave ( she was at doorway as she was trying to leave as patient had cornered staff with invasive/inappropriate questioning), exam completed and  pt was agitated/ irate during discussion of treatment plan as he stated he had albuterol  and prednisone  at home from last visit and zpak didn't help either. But he would get his meds  as money was tight and he would take them. Discussed debrox use for earwax removal, patient inquired of refill on thyroid med and I advised pt that I was unable to refill med as he had not had thyroid labs since 2017 and he was seen last month(10/29/24) and had received refill of meds and advised provider Venetia Motto at that time he had an appt with PCP this month(01/26) to have labs and meds refilled for January. Pt states he saw PCP and had labs done last week.  I reiterated that PCP should refill meds, that we were unable to manage thyroid meds and pt then became loud and stated I don't like your spirit and I feel very uncomfortable with you in here at which time I advised pt that if he  did not feel comfortable with me, then I was not going to continue the discussion and it made me uncomfortable, so I will end the visit at this time I turned off the computer and walked out to the office with patient following behind me . This patient is aggressive and inappropriate with staff with a history of excessive anger documented in chart. Pt given verbal discharge instructions, pt left without written discharge instructions, charge nurse, Katie aware.   Ddx:  Bronchitis, cerumen impaction, smoker, noncompliance with medication Final Clinical Impressions(s) / UC Diagnoses   Final diagnoses:  Bronchitis  Smoker  Cerumen debris on tympanic membrane of left ear     Discharge Instructions      Please follow up with your PCP regarding medication refill and labs(thyroid meds) Please take your prednisone  and inhaler from previous visit Use debrox as discussed for ear wax as package directed      ED Prescriptions     Medication Sig Dispense Auth. Provider   carbamide peroxide (DEBROX) 6.5 % OTIC solution Place 5 drops into the left ear 2 (two) times daily. 15 mL Ashly Yepez, Rilla, NP      PDMP not reviewed this encounter.      [1]  Social History Tobacco Use   Smoking status: Every Day    Current packs/day: 0.50    Average packs/day: 0.5 packs/day for 35.0 years (17.5 ttl pk-yrs)    Types: Cigarettes   Smokeless tobacco: Never  Vaping Use   Vaping status: Never Used  Substance Use Topics   Alcohol use: Yes    Comment: rarely   Drug use: Yes    Types: Marijuana    Comment: recovering crack addict-clean     Aminta Rilla, NP 12/09/24 2138  "

## 2024-12-09 NOTE — Discharge Instructions (Addendum)
 Please follow up with your PCP regarding medication refill and labs(thyroid meds) Please take your prednisone  and inhaler from previous visit Use debrox as discussed for ear wax as package directed
